# Patient Record
Sex: Male | Born: 1945 | Race: White | Hispanic: No | Marital: Married | Smoking: Former smoker
Health system: Southern US, Community
[De-identification: ages and names within clinical notes are randomized; demographics above are authoritative.]

## PROBLEM LIST (undated history)

## (undated) DIAGNOSIS — I429 Cardiomyopathy, unspecified: Secondary | ICD-10-CM

## (undated) DIAGNOSIS — I2510 Atherosclerotic heart disease of native coronary artery without angina pectoris: Secondary | ICD-10-CM

## (undated) DIAGNOSIS — K5792 Diverticulitis of intestine, part unspecified, without perforation or abscess without bleeding: Secondary | ICD-10-CM

## (undated) DIAGNOSIS — I509 Heart failure, unspecified: Secondary | ICD-10-CM

## (undated) DIAGNOSIS — N189 Chronic kidney disease, unspecified: Secondary | ICD-10-CM

## (undated) DIAGNOSIS — G629 Polyneuropathy, unspecified: Secondary | ICD-10-CM

## (undated) HISTORY — PX: ROTATOR CUFF REPAIR: SHX139

## (undated) HISTORY — PX: APPENDECTOMY: SHX54

## (undated) HISTORY — PX: CARDIAC CATHETERIZATION: SHX172

## (undated) HISTORY — PX: FINGER GANGLION CYST EXCISION: SHX1636

## (undated) HISTORY — PX: PARTIAL COLECTOMY: SHX5273

## (undated) HISTORY — PX: CORONARY ANGIOPLASTY: SHX604

## (undated) HISTORY — PX: OTHER SURGICAL HISTORY: SHX169

## (undated) HISTORY — PX: ELBOW SURGERY: SHX618

---

## 2010-03-29 DIAGNOSIS — I2510 Atherosclerotic heart disease of native coronary artery without angina pectoris: Secondary | ICD-10-CM

## 2010-03-29 HISTORY — DX: Atherosclerotic heart disease of native coronary artery without angina pectoris: I25.10

## 2012-03-29 HISTORY — PX: INSERT / REPLACE / REMOVE PACEMAKER: SUR710

## 2016-08-02 ENCOUNTER — Inpatient Hospital Stay (HOSPITAL_COMMUNITY)
Admission: AD | Admit: 2016-08-02 | Discharge: 2016-08-10 | DRG: 308 | Disposition: A | Discharge status: Home / Self Care | Payer: Non-veteran care | Source: Other Acute Inpatient Hospital | Attending: Family Medicine | Admitting: Family Medicine

## 2016-08-02 ENCOUNTER — Encounter (HOSPITAL_COMMUNITY): Payer: Self-pay | Admitting: Cardiology

## 2016-08-02 DIAGNOSIS — E876 Hypokalemia: Secondary | ICD-10-CM | POA: Diagnosis not present

## 2016-08-02 DIAGNOSIS — I480 Paroxysmal atrial fibrillation: Secondary | ICD-10-CM | POA: Diagnosis present

## 2016-08-02 DIAGNOSIS — R778 Other specified abnormalities of plasma proteins: Secondary | ICD-10-CM

## 2016-08-02 DIAGNOSIS — R7989 Other specified abnormal findings of blood chemistry: Secondary | ICD-10-CM | POA: Diagnosis not present

## 2016-08-02 DIAGNOSIS — J9600 Acute respiratory failure, unspecified whether with hypoxia or hypercapnia: Secondary | ICD-10-CM | POA: Diagnosis present

## 2016-08-02 DIAGNOSIS — Z7902 Long term (current) use of antithrombotics/antiplatelets: Secondary | ICD-10-CM

## 2016-08-02 DIAGNOSIS — I4729 Other ventricular tachycardia: Secondary | ICD-10-CM

## 2016-08-02 DIAGNOSIS — I5023 Acute on chronic systolic (congestive) heart failure: Secondary | ICD-10-CM | POA: Diagnosis present

## 2016-08-02 DIAGNOSIS — N170 Acute kidney failure with tubular necrosis: Secondary | ICD-10-CM | POA: Diagnosis present

## 2016-08-02 DIAGNOSIS — I472 Ventricular tachycardia: Secondary | ICD-10-CM

## 2016-08-02 DIAGNOSIS — I255 Ischemic cardiomyopathy: Secondary | ICD-10-CM | POA: Diagnosis present

## 2016-08-02 DIAGNOSIS — N179 Acute kidney failure, unspecified: Secondary | ICD-10-CM | POA: Diagnosis not present

## 2016-08-02 DIAGNOSIS — I252 Old myocardial infarction: Secondary | ICD-10-CM | POA: Diagnosis not present

## 2016-08-02 DIAGNOSIS — Z87891 Personal history of nicotine dependence: Secondary | ICD-10-CM | POA: Diagnosis not present

## 2016-08-02 DIAGNOSIS — K7589 Other specified inflammatory liver diseases: Secondary | ICD-10-CM | POA: Diagnosis present

## 2016-08-02 DIAGNOSIS — Z95810 Presence of automatic (implantable) cardiac defibrillator: Secondary | ICD-10-CM | POA: Diagnosis not present

## 2016-08-02 DIAGNOSIS — I248 Other forms of acute ischemic heart disease: Secondary | ICD-10-CM | POA: Diagnosis present

## 2016-08-02 DIAGNOSIS — I2510 Atherosclerotic heart disease of native coronary artery without angina pectoris: Secondary | ICD-10-CM | POA: Diagnosis present

## 2016-08-02 DIAGNOSIS — Z7901 Long term (current) use of anticoagulants: Secondary | ICD-10-CM | POA: Diagnosis not present

## 2016-08-02 DIAGNOSIS — K859 Acute pancreatitis without necrosis or infection, unspecified: Secondary | ICD-10-CM | POA: Diagnosis present

## 2016-08-02 DIAGNOSIS — R0989 Other specified symptoms and signs involving the circulatory and respiratory systems: Secondary | ICD-10-CM

## 2016-08-02 DIAGNOSIS — E875 Hyperkalemia: Secondary | ICD-10-CM | POA: Diagnosis present

## 2016-08-02 DIAGNOSIS — I4891 Unspecified atrial fibrillation: Secondary | ICD-10-CM

## 2016-08-02 DIAGNOSIS — R0602 Shortness of breath: Secondary | ICD-10-CM

## 2016-08-02 DIAGNOSIS — I420 Dilated cardiomyopathy: Secondary | ICD-10-CM

## 2016-08-02 DIAGNOSIS — I429 Cardiomyopathy, unspecified: Secondary | ICD-10-CM | POA: Diagnosis not present

## 2016-08-02 DIAGNOSIS — K7200 Acute and subacute hepatic failure without coma: Secondary | ICD-10-CM | POA: Diagnosis present

## 2016-08-02 DIAGNOSIS — Z7982 Long term (current) use of aspirin: Secondary | ICD-10-CM

## 2016-08-02 DIAGNOSIS — G629 Polyneuropathy, unspecified: Secondary | ICD-10-CM | POA: Diagnosis present

## 2016-08-02 DIAGNOSIS — Z79899 Other long term (current) drug therapy: Secondary | ICD-10-CM | POA: Diagnosis not present

## 2016-08-02 DIAGNOSIS — I509 Heart failure, unspecified: Secondary | ICD-10-CM | POA: Diagnosis not present

## 2016-08-02 DIAGNOSIS — F1020 Alcohol dependence, uncomplicated: Secondary | ICD-10-CM | POA: Diagnosis present

## 2016-08-02 DIAGNOSIS — R945 Abnormal results of liver function studies: Secondary | ICD-10-CM

## 2016-08-02 DIAGNOSIS — D649 Anemia, unspecified: Secondary | ICD-10-CM | POA: Diagnosis not present

## 2016-08-02 DIAGNOSIS — Z955 Presence of coronary angioplasty implant and graft: Secondary | ICD-10-CM

## 2016-08-02 DIAGNOSIS — D6959 Other secondary thrombocytopenia: Secondary | ICD-10-CM | POA: Diagnosis present

## 2016-08-02 DIAGNOSIS — D696 Thrombocytopenia, unspecified: Secondary | ICD-10-CM | POA: Diagnosis not present

## 2016-08-02 DIAGNOSIS — D638 Anemia in other chronic diseases classified elsewhere: Secondary | ICD-10-CM | POA: Diagnosis present

## 2016-08-02 DIAGNOSIS — N19 Unspecified kidney failure: Secondary | ICD-10-CM | POA: Diagnosis not present

## 2016-08-02 DIAGNOSIS — I342 Nonrheumatic mitral (valve) stenosis: Secondary | ICD-10-CM | POA: Diagnosis not present

## 2016-08-02 DIAGNOSIS — R748 Abnormal levels of other serum enzymes: Secondary | ICD-10-CM | POA: Diagnosis not present

## 2016-08-02 DIAGNOSIS — K5792 Diverticulitis of intestine, part unspecified, without perforation or abscess without bleeding: Secondary | ICD-10-CM | POA: Diagnosis present

## 2016-08-02 HISTORY — DX: Cardiomyopathy, unspecified: I42.9

## 2016-08-02 HISTORY — DX: Diverticulitis of intestine, part unspecified, without perforation or abscess without bleeding: K57.92

## 2016-08-02 HISTORY — DX: Polyneuropathy, unspecified: G62.9

## 2016-08-02 HISTORY — DX: Chronic kidney disease, unspecified: N18.9

## 2016-08-02 HISTORY — DX: Heart failure, unspecified: I50.9

## 2016-08-02 HISTORY — DX: Atherosclerotic heart disease of native coronary artery without angina pectoris: I25.10

## 2016-08-02 LAB — BASIC METABOLIC PANEL
Anion gap: 11 (ref 5–15)
BUN: 55 mg/dL — AB (ref 6–20)
CALCIUM: 9.2 mg/dL (ref 8.9–10.3)
CHLORIDE: 105 mmol/L (ref 101–111)
CO2: 21 mmol/L — AB (ref 22–32)
CREATININE: 2.79 mg/dL — AB (ref 0.61–1.24)
GFR calc Af Amer: 25 mL/min — ABNORMAL LOW (ref >60)
GFR calc non Af Amer: 21 mL/min — ABNORMAL LOW (ref >60)
Glucose, Bld: 106 mg/dL — ABNORMAL HIGH (ref 65–99)
Potassium: 5.6 mmol/L — ABNORMAL HIGH (ref 3.5–5.1)
SODIUM: 137 mmol/L (ref 135–145)

## 2016-08-02 LAB — TROPONIN I: TROPONIN I: 0.23 ng/mL — AB (ref <0.03)

## 2016-08-02 LAB — HEPATIC FUNCTION PANEL
ALT: 88 U/L — ABNORMAL HIGH (ref 17–63)
AST: 59 U/L — ABNORMAL HIGH (ref 15–41)
Albumin: 4 g/dL (ref 3.5–5.0)
Alkaline Phosphatase: 79 U/L (ref 38–126)
BILIRUBIN INDIRECT: 1.5 mg/dL — AB (ref 0.3–0.9)
Bilirubin, Direct: 0.3 mg/dL (ref 0.1–0.5)
TOTAL PROTEIN: 6.8 g/dL (ref 6.5–8.1)
Total Bilirubin: 1.8 mg/dL — ABNORMAL HIGH (ref 0.3–1.2)

## 2016-08-02 LAB — LIPID PANEL
CHOLESTEROL: 145 mg/dL (ref 0–200)
HDL: 47 mg/dL (ref >40)
LDL Cholesterol: 86 mg/dL (ref 0–99)
TRIGLYCERIDES: 59 mg/dL (ref <150)
Total CHOL/HDL Ratio: 3.1 RATIO
VLDL: 12 mg/dL (ref 0–40)

## 2016-08-02 LAB — CBC WITH DIFFERENTIAL/PLATELET
BASOS PCT: 0 %
Basophils Absolute: 0 10*3/uL (ref 0.0–0.1)
EOS PCT: 0 %
Eosinophils Absolute: 0 10*3/uL (ref 0.0–0.7)
HCT: 38.8 % — ABNORMAL LOW (ref 39.0–52.0)
Hemoglobin: 12.9 g/dL — ABNORMAL LOW (ref 13.0–17.0)
LYMPHS ABS: 2.2 10*3/uL (ref 0.7–4.0)
Lymphocytes Relative: 21 %
MCH: 31.9 pg (ref 26.0–34.0)
MCHC: 33.2 g/dL (ref 30.0–36.0)
MCV: 95.8 fL (ref 78.0–100.0)
MONOS PCT: 7 %
Monocytes Absolute: 0.7 10*3/uL (ref 0.1–1.0)
Neutro Abs: 7.4 10*3/uL (ref 1.7–7.7)
Neutrophils Relative %: 71 %
PLATELETS: 108 10*3/uL — AB (ref 150–400)
RBC: 4.05 MIL/uL — ABNORMAL LOW (ref 4.22–5.81)
RDW: 13.4 % (ref 11.5–15.5)
WBC: 10.4 10*3/uL (ref 4.0–10.5)

## 2016-08-02 LAB — TSH: TSH: 6.757 u[IU]/mL — ABNORMAL HIGH (ref 0.350–4.500)

## 2016-08-02 MED ORDER — DULOXETINE HCL 60 MG PO CPEP
90.0000 mg | ORAL_CAPSULE | Freq: Every day | ORAL | Status: DC
  Administered 2016-08-03 – 2016-08-10 (×8): 90 mg via ORAL
  Filled 2016-08-02 (×8): qty 1

## 2016-08-02 MED ORDER — CLOPIDOGREL BISULFATE 75 MG PO TABS
75.0000 mg | ORAL_TABLET | Freq: Every day | ORAL | Status: DC
  Administered 2016-08-03 – 2016-08-10 (×8): 75 mg via ORAL
  Filled 2016-08-02 (×8): qty 1

## 2016-08-02 MED ORDER — DILTIAZEM HCL-DEXTROSE 100-5 MG/100ML-% IV SOLN (PREMIX)
5.0000 mg/h | INTRAVENOUS | Status: DC
  Administered 2016-08-02: 10 mg/h via INTRAVENOUS
  Administered 2016-08-03: 5 mg/h via INTRAVENOUS
  Filled 2016-08-02 (×2): qty 100

## 2016-08-02 MED ORDER — METOPROLOL TARTRATE 12.5 MG HALF TABLET
12.5000 mg | ORAL_TABLET | Freq: Two times a day (BID) | ORAL | Status: DC
  Administered 2016-08-02 – 2016-08-03 (×2): 12.5 mg via ORAL
  Filled 2016-08-02 (×2): qty 1

## 2016-08-02 MED ORDER — DILTIAZEM LOAD VIA INFUSION
10.0000 mg | Freq: Once | INTRAVENOUS | Status: AC
  Administered 2016-08-02: 10 mg via INTRAVENOUS
  Filled 2016-08-02: qty 10

## 2016-08-02 MED ORDER — ATORVASTATIN CALCIUM 80 MG PO TABS
80.0000 mg | ORAL_TABLET | Freq: Every day | ORAL | Status: DC
  Administered 2016-08-03: 80 mg via ORAL
  Filled 2016-08-02: qty 1

## 2016-08-02 MED ORDER — ASPIRIN EC 81 MG PO TBEC
81.0000 mg | DELAYED_RELEASE_TABLET | Freq: Every day | ORAL | Status: DC
  Administered 2016-08-03: 81 mg via ORAL
  Filled 2016-08-02: qty 1

## 2016-08-02 MED ORDER — HEPARIN (PORCINE) IN NACL 100-0.45 UNIT/ML-% IJ SOLN
1350.0000 [IU]/h | INTRAMUSCULAR | Status: DC
  Administered 2016-08-02 – 2016-08-06 (×6): 1200 [IU]/h via INTRAVENOUS
  Administered 2016-08-08: 1350 [IU]/h via INTRAVENOUS
  Filled 2016-08-02 (×9): qty 250

## 2016-08-02 NOTE — Consult Note (Addendum)
Admit date: 08/02/2016 Referring Physician: Dr. Bard Herbert Primary Cardiologist: Dr. Eden Emms Chief complaint/reason for admission:afib with RVR  HPI: Jerry Baker is a 71 y.o. male who is being seen today for the evaluation of afib with RVr at the request of Dr. Jeri Lager at [facility].    This is 71yo male with a history ASCAD with a history of stent placement x 2 in the past, mild LV dysfunction with EF 40% and history of AICD and PPM reportedly by the patient but no chart in Care Everywhere.  He presented to [facility] with complaints of SOB and palpitations that started last Wednesday.  He says this apparently started after he had a CT with contrast of his right shoulder and the next day started to feel funny last week.  He then started to notice that his BP and HR on his BP cuff were fluctuating.  He says that he would break out in a sweat and then it would subside. He waited 2-3 days as he thought it was the contrast.  Over the weekend he started getting SOB and felt bad but not bad enough to play golf today. When he got home from golf he started feeling nauseated and vomited.  He presented to the ER and was found to be in afib with RVR with HR 140-160bpm.  He denied any chest pain or pressure.   He is on Plavix and ASA for his CAD but has not had any prior history of afib. He was loaded with IV heparin gtt and started on IV Cardizem.  Creatinine was noted to be elevated at 2.6 c/w acute renal failure ? Secondary to contrast mediated nephropathy from CT last week.  He was noted to have an elevated troponin at 0.15.    PMH:    Past Medical History:  Diagnosis Date  . Cardiomyopathy (HCC)    EF 40% s/p dual chamber AICD  . CHF (congestive heart failure) (HCC)   . CKD (chronic kidney disease)    told by his PCP about 8 months ago and was seen by a nephrologist and his ACE I was stopped.    . Coronary artery disease 2012   s/p MI 2012 with PCI and 2 stents  .  Diverticulitis   . Peripheral neuropathy     PSH:    Past Surgical History:  Procedure Laterality Date  . APPENDECTOMY    . CARDIAC CATHETERIZATION    . CORONARY ANGIOPLASTY    . ELBOW SURGERY Left   . FINGER GANGLION CYST EXCISION    . INSERT / REPLACE / REMOVE PACEMAKER  2014   dual chamber AICD  . ROTATOR CUFF REPAIR Left     ALLERGIES:   Penicillins  Prior to Admit Meds:   Prescriptions Prior to Admission  Medication Sig Dispense Refill Last Dose  . aspirin EC 81 MG tablet Take 81 mg by mouth daily.     . clopidogrel (PLAVIX) 75 MG tablet Take 75 mg by mouth daily.     . Cyanocobalamin (VITAMIN B 12 PO) Take 100 mcg by mouth daily.     . DULoxetine (CYMBALTA) 20 MG capsule Take 30 mg by mouth daily.       Family HX:    Family History  Problem Relation Age of Onset  . Dementia Mother   . Breast cancer Mother   . Cancer Father   . Parkinson's disease Sister    Social HX:    Social History  Social History  . Marital status: N/A    Spouse name: N/A  . Number of children: N/A  . Years of education: N/A   Occupational History  . Not on file.   Social History Main Topics  . Smoking status: Former Smoker    Quit date: 08/03/2010  . Smokeless tobacco: Never Used  . Alcohol use 4.2 oz/week    7 Cans of beer per week  . Drug use: No  . Sexual activity: Not on file   Other Topics Concern  . Not on file   Social History Narrative  . No narrative on file     ROS:  All ROS were addressed and are negative except what is stated in the HPI  PHYSICAL EXAM Vitals:   08/02/16 2000  Resp: (!) 25   General: Well developed, well nourished, in no acute distress Head: Eyes PERRLA, No xanthomas.   Normal cephalic and atramatic  Lungs:   Clear bilaterally to auscultation and percussion. Heart:   Irregularly irregular S1 S2 Pulses are 2+ & equal.            No carotid bruit. No JVD.  No abdominal bruits. No femoral bruits. Abdomen: Bowel sounds are positive, abdomen soft  and non-tender without masses  Msk:  Back normal, normal gait. Normal strength and tone for age. Extremities:   No clubbing, cyanosis or edema.  DP +1 Neuro: Alert and oriented X 3. Psych:  Good affect, responds appropriately   Labs:  No results found for: WBC, HGB, HCT, MCV, PLT No results for input(s): NA, K, CL, CO2, BUN, CREATININE, CALCIUM, PROT, BILITOT, ALKPHOS, ALT, AST, GLUCOSE in the last 168 hours.  Invalid input(s): LABALBU No results found for: CKTOTAL, CKMB, CKMBINDEX, TROPONINI No results found for: PTT No results found for: INR, PROTIME  No results found for: CHOL No results found for: HDL No results found for: LDLCALC No results found for: TRIG No results found for: CHOLHDL No results found for: LDLDIRECT    Radiology:  No results found.   Telemetry    Atrial fibrillation with RVR - Personally Reviewed  ECG    Atrial fibrillation with RVR at 103bpm with inferior infarct, anteroseptal infarct and ST/T wave abnormality in the anterolateral leads - Personally Reviewed   ASSESSMENT/PLAN:    1.  New onset atrial fibrillation of unknown duration - most likely a week ago.  His HR is still high in the 130's.  He currently on IV Heparin gtt and Cardizem gtt.   - Continue IV Heparin per pharmacy - Continue IV Cardizem gtt and titrate for HR as BP allows - CHADS2VASC score is 3 (CAD, age > 30 and CHF) so will need long term anticoagulation.  Given underlying acute renal failure he is not a candidate for NOAC and warfarin should be used. Will start once assured that no procedures will need to be done.  - Check TSH - check 2D echo in am for LA size and LVF. - continue Coreg - he was on this at home but does not know the dose.    2.  ACSAD with remote MI x 2 and s/p remote PCI x 2.  No old records available. - continue ASA/Plavix for now - Will likely stop ASA and continue on Plavix once warfarin is started - he is currently not on statin therapy.   - Will add  Lipitor 80mg  daily - check FLP and ALT in am - Will try to get old records. -  check 2D echo in am  3.  DCM - reportedly with EF 40%.  He is s/p AICD - await results of echo - had been on Coreg but does not know the dose.  Will add Lopressor 12.5mg  BID for better rate control - no ACE or ARB due to AKI  4.  Acute renal failure with N/V ? Etiology.  Symptoms started after IV contrast last week for his shoulder.  ? Contrast induced nephropathy. - workup per IM  5.  Mildly elevated troponin - likely secondary to demand ischemia in the setting of afib with RVR and acute kidney injury.  He has not had any CP. - will continue to cycle - 2D echo pending  Armanda Magicraci Dynastie Knoop, MD  08/02/2016  9:37 PM

## 2016-08-02 NOTE — Progress Notes (Signed)
ANTICOAGULATION CONSULT NOTE - Initial Consult  Pharmacy Consult for Heparin Indication: atrial fibrillation  Allergies  Allergen Reactions  . Penicillins     Patient Measurements:  Weight = 91 kg   Vital Signs:    Labs: No results for input(s): HGB, HCT, PLT, APTT, LABPROT, INR, HEPARINUNFRC, HEPRLOWMOCWT, CREATININE, CKTOTAL, CKMB, TROPONINI in the last 72 hours.  CrCl cannot be calculated (No order found.).   Medical History: Past Medical History:  Diagnosis Date  . Cardiomyopathy (HCC)    EF 40% s/p dual chamber AICD  . CHF (congestive heart failure) (HCC)   . CKD (chronic kidney disease)    told by his PCP about 8 months ago and was seen by a nephrologist and his ACE I was stopped.    . Coronary artery disease 2012   s/p MI 2012 with PCI and 2 stents  . Diverticulitis   . Peripheral neuropathy      Assessment: [age] YOM [facility] with new onset Afib RVR and AKI. Pharmacy is consulted to manage heparin dosing. Heparin was started prior to transfer at 1730. Pt received 5000 unit bolus, current infusion rate is only 400 units/hr. Baseline hgb 13.1, pltc 106K, INR 1.2   Goal of Therapy:  Heparin level 0.3-0.7 units/ml Monitor platelets by anticoagulation protocol: Yes   Plan:  Heparin infusion 1200 units/hr (RN notified to switch to a new bag with our supply) F/u 8 hr heparin level at 0700 Daily heparin level and CBC f/u plans for oral anticoagulation  Bayard HuggerMei Kelli Robeck, PharmD, BCPS  Clinical Pharmacist  Pager: [phone]   08/02/2016,10:03 PM

## 2016-08-03 ENCOUNTER — Inpatient Hospital Stay (HOSPITAL_COMMUNITY): Payer: Non-veteran care

## 2016-08-03 ENCOUNTER — Encounter (HOSPITAL_COMMUNITY): Payer: Self-pay | Admitting: Internal Medicine

## 2016-08-03 DIAGNOSIS — N19 Unspecified kidney failure: Secondary | ICD-10-CM

## 2016-08-03 DIAGNOSIS — I429 Cardiomyopathy, unspecified: Secondary | ICD-10-CM

## 2016-08-03 DIAGNOSIS — I4891 Unspecified atrial fibrillation: Secondary | ICD-10-CM

## 2016-08-03 DIAGNOSIS — D696 Thrombocytopenia, unspecified: Secondary | ICD-10-CM

## 2016-08-03 DIAGNOSIS — I2510 Atherosclerotic heart disease of native coronary artery without angina pectoris: Secondary | ICD-10-CM

## 2016-08-03 DIAGNOSIS — I255 Ischemic cardiomyopathy: Secondary | ICD-10-CM

## 2016-08-03 DIAGNOSIS — F1020 Alcohol dependence, uncomplicated: Secondary | ICD-10-CM

## 2016-08-03 DIAGNOSIS — I509 Heart failure, unspecified: Secondary | ICD-10-CM

## 2016-08-03 DIAGNOSIS — R0602 Shortness of breath: Secondary | ICD-10-CM

## 2016-08-03 DIAGNOSIS — N179 Acute kidney failure, unspecified: Secondary | ICD-10-CM

## 2016-08-03 DIAGNOSIS — D649 Anemia, unspecified: Secondary | ICD-10-CM

## 2016-08-03 LAB — FOLATE: Folate: 59.7 ng/mL (ref >5.9)

## 2016-08-03 LAB — CBC
HEMATOCRIT: 37.8 % — AB (ref 39.0–52.0)
HEMOGLOBIN: 12.4 g/dL — AB (ref 13.0–17.0)
MCH: 31.2 pg (ref 26.0–34.0)
MCHC: 32.8 g/dL (ref 30.0–36.0)
MCV: 95.2 fL (ref 78.0–100.0)
PLATELETS: 101 10*3/uL — AB (ref 150–400)
RBC: 3.97 MIL/uL — AB (ref 4.22–5.81)
RDW: 13.5 % (ref 11.5–15.5)
WBC: 8.4 10*3/uL (ref 4.0–10.5)

## 2016-08-03 LAB — URINALYSIS, ROUTINE W REFLEX MICROSCOPIC
Bilirubin Urine: NEGATIVE
Glucose, UA: NEGATIVE mg/dL
Hgb urine dipstick: NEGATIVE
Ketones, ur: 5 mg/dL — AB
Leukocytes, UA: NEGATIVE
Nitrite: NEGATIVE
Protein, ur: 100 mg/dL — AB
RBC / HPF: NONE SEEN RBC/hpf (ref 0–5)
Specific Gravity, Urine: 1.025 (ref 1.005–1.030)
Squamous Epithelial / HPF: NONE SEEN
pH: 5 (ref 5.0–8.0)

## 2016-08-03 LAB — BASIC METABOLIC PANEL
ANION GAP: 11 (ref 5–15)
BUN: 61 mg/dL — ABNORMAL HIGH (ref 6–20)
CHLORIDE: 106 mmol/L (ref 101–111)
CO2: 20 mmol/L — ABNORMAL LOW (ref 22–32)
Calcium: 8.7 mg/dL — ABNORMAL LOW (ref 8.9–10.3)
Creatinine, Ser: 3.35 mg/dL — ABNORMAL HIGH (ref 0.61–1.24)
GFR calc Af Amer: 20 mL/min — ABNORMAL LOW (ref >60)
GFR, EST NON AFRICAN AMERICAN: 17 mL/min — AB (ref >60)
GLUCOSE: 153 mg/dL — AB (ref 65–99)
POTASSIUM: 5.5 mmol/L — AB (ref 3.5–5.1)
Sodium: 137 mmol/L (ref 135–145)

## 2016-08-03 LAB — LACTATE DEHYDROGENASE: LDH: 1069 U/L — ABNORMAL HIGH (ref 98–192)

## 2016-08-03 LAB — DIC (DISSEMINATED INTRAVASCULAR COAGULATION)PANEL
D-Dimer, Quant: 3.62 ug/mL-FEU — ABNORMAL HIGH (ref 0.00–0.50)
INR: 1.59
Platelets: 95 10*3/uL — ABNORMAL LOW (ref 150–400)
Prothrombin Time: 19.1 seconds — ABNORMAL HIGH (ref 11.4–15.2)
aPTT: 113 seconds — ABNORMAL HIGH (ref 24–36)

## 2016-08-03 LAB — RETICULOCYTES
RBC.: 4.07 MIL/uL — ABNORMAL LOW (ref 4.22–5.81)
RETIC CT PCT: 1.6 % (ref 0.4–3.1)
Retic Count, Absolute: 65.1 10*3/uL (ref 19.0–186.0)

## 2016-08-03 LAB — TROPONIN I
Troponin I: 0.23 ng/mL
Troponin I: 0.17 ng/mL (ref <0.03)

## 2016-08-03 LAB — SODIUM, URINE, RANDOM: Sodium, Ur: <10 mmol/L

## 2016-08-03 LAB — RAPID URINE DRUG SCREEN, HOSP PERFORMED
Amphetamines: NOT DETECTED
BARBITURATES: NOT DETECTED
Benzodiazepines: NOT DETECTED
COCAINE: NOT DETECTED
Opiates: NOT DETECTED
TETRAHYDROCANNABINOL: NOT DETECTED

## 2016-08-03 LAB — DIC (DISSEMINATED INTRAVASCULAR COAGULATION) PANEL
FIBRINOGEN: 364 mg/dL (ref 210–475)
SMEAR REVIEW: NONE SEEN

## 2016-08-03 LAB — IRON AND TIBC
Iron: 129 ug/dL (ref 45–182)
Saturation Ratios: 41 % — ABNORMAL HIGH (ref 17.9–39.5)
TIBC: 318 ug/dL (ref 250–450)
UIBC: 189 ug/dL

## 2016-08-03 LAB — MAGNESIUM: Magnesium: 2.4 mg/dL (ref 1.7–2.4)

## 2016-08-03 LAB — HEPARIN LEVEL (UNFRACTIONATED)
HEPARIN UNFRACTIONATED: 0.51 [IU]/mL (ref 0.30–0.70)
Heparin Unfractionated: 0.33 IU/mL (ref 0.30–0.70)

## 2016-08-03 LAB — PATHOLOGIST SMEAR REVIEW

## 2016-08-03 LAB — LIPASE, BLOOD: Lipase: 21 U/L (ref 11–51)

## 2016-08-03 LAB — FERRITIN: FERRITIN: 1209 ng/mL — AB (ref 24–336)

## 2016-08-03 LAB — BRAIN NATRIURETIC PEPTIDE: B NATRIURETIC PEPTIDE 5: 763.3 pg/mL — AB (ref 0.0–100.0)

## 2016-08-03 LAB — VITAMIN B12: Vitamin B-12: 5493 pg/mL — ABNORMAL HIGH (ref 180–914)

## 2016-08-03 LAB — LACTIC ACID, PLASMA: Lactic Acid, Venous: 1.9 mmol/L (ref 0.5–1.9)

## 2016-08-03 LAB — CREATININE, URINE, RANDOM: Creatinine, Urine: 322.5 mg/dL

## 2016-08-03 MED ORDER — DILTIAZEM HCL 30 MG PO TABS
30.0000 mg | ORAL_TABLET | Freq: Four times a day (QID) | ORAL | Status: DC
  Administered 2016-08-03 – 2016-08-04 (×3): 30 mg via ORAL
  Filled 2016-08-03 (×3): qty 1

## 2016-08-03 MED ORDER — PROMETHAZINE HCL 25 MG/ML IJ SOLN
12.5000 mg | Freq: Once | INTRAMUSCULAR | Status: AC
  Administered 2016-08-03: 12.5 mg via INTRAVENOUS
  Filled 2016-08-03: qty 1

## 2016-08-03 MED ORDER — SODIUM CHLORIDE 0.9 % IV BOLUS (SEPSIS): 500.0000 mL | Freq: Once | INTRAVENOUS | Status: DC

## 2016-08-03 MED ORDER — SODIUM POLYSTYRENE SULFONATE 15 GM/60ML PO SUSP
30.0000 g | Freq: Once | ORAL | Status: AC
  Administered 2016-08-03: 30 g via ORAL
  Filled 2016-08-03: qty 120

## 2016-08-03 MED ORDER — SODIUM CHLORIDE 0.9 % IV SOLN
INTRAVENOUS | Status: DC
  Administered 2016-08-03: 10:00:00 via INTRAVENOUS

## 2016-08-03 MED ORDER — THIAMINE HCL 100 MG/ML IJ SOLN: 100.0000 mg | Freq: Every day | INTRAMUSCULAR | Status: DC

## 2016-08-03 MED ORDER — WARFARIN SODIUM 5 MG PO TABS
5.0000 mg | ORAL_TABLET | Freq: Once | ORAL | Status: AC
  Administered 2016-08-03: 5 mg via ORAL
  Filled 2016-08-03: qty 1

## 2016-08-03 MED ORDER — ADULT MULTIVITAMIN W/MINERALS CH
1.0000 | ORAL_TABLET | Freq: Every day | ORAL | Status: DC
  Administered 2016-08-03 – 2016-08-10 (×8): 1 via ORAL
  Filled 2016-08-03 (×8): qty 1

## 2016-08-03 MED ORDER — PROMETHAZINE HCL 25 MG/ML IJ SOLN
12.5000 mg | Freq: Four times a day (QID) | INTRAMUSCULAR | Status: DC | PRN
  Administered 2016-08-03: 12.5 mg via INTRAVENOUS
  Filled 2016-08-03: qty 1

## 2016-08-03 MED ORDER — ACETAMINOPHEN 325 MG PO TABS: 650.0000 mg | ORAL_TABLET | Freq: Four times a day (QID) | ORAL | Status: DC | PRN

## 2016-08-03 MED ORDER — SODIUM CHLORIDE 0.9 % IV BOLUS (SEPSIS)
500.0000 mL | Freq: Once | INTRAVENOUS | Status: AC
  Administered 2016-08-03: 500 mL via INTRAVENOUS

## 2016-08-03 MED ORDER — FOLIC ACID 1 MG PO TABS
1.0000 mg | ORAL_TABLET | Freq: Every day | ORAL | Status: DC
  Administered 2016-08-03 – 2016-08-10 (×8): 1 mg via ORAL
  Filled 2016-08-03 (×8): qty 1

## 2016-08-03 MED ORDER — ACETAMINOPHEN 650 MG RE SUPP: 650.0000 mg | Freq: Four times a day (QID) | RECTAL | Status: DC | PRN

## 2016-08-03 MED ORDER — LORAZEPAM 1 MG PO TABS
1.0000 mg | ORAL_TABLET | Freq: Four times a day (QID) | ORAL | Status: DC | PRN
  Administered 2016-08-04: 1 mg via ORAL
  Filled 2016-08-03: qty 1

## 2016-08-03 MED ORDER — VITAMIN B-1 100 MG PO TABS
100.0000 mg | ORAL_TABLET | Freq: Every day | ORAL | Status: DC
  Administered 2016-08-03 – 2016-08-10 (×8): 100 mg via ORAL
  Filled 2016-08-03 (×8): qty 1

## 2016-08-03 MED ORDER — SODIUM CHLORIDE 0.9 % IV SOLN
INTRAVENOUS | Status: DC
  Administered 2016-08-03 (×2): via INTRAVENOUS

## 2016-08-03 MED ORDER — LORAZEPAM 2 MG/ML IJ SOLN: 1.0000 mg | Freq: Four times a day (QID) | INTRAMUSCULAR | Status: DC | PRN

## 2016-08-03 MED ORDER — METOPROLOL TARTRATE 50 MG PO TABS
50.0000 mg | ORAL_TABLET | Freq: Two times a day (BID) | ORAL | Status: DC
  Administered 2016-08-03 – 2016-08-08 (×10): 50 mg via ORAL
  Filled 2016-08-03 (×10): qty 1

## 2016-08-03 MED ORDER — WARFARIN - PHARMACIST DOSING INPATIENT: Freq: Every day | Status: DC

## 2016-08-03 NOTE — Final Progress Note (Signed)
Attempted echocardiogram but HR in the high 120's. We try again later. B.Charlee Squibb

## 2016-08-03 NOTE — Progress Notes (Signed)
Progress Note  Patient Name: Jerry Baker Date of Encounter: 08/03/2016  Primary Cardiologist: Armanda Magic Wants to continue to f/u with [facility] on d/c  Subjective   Less dyspnea no chest pain   Inpatient Medications    Scheduled Meds: . aspirin EC  81 mg Oral Daily  . atorvastatin  80 mg Oral q1800  . clopidogrel  75 mg Oral Daily  . DULoxetine  90 mg Oral Daily  . folic acid  1 mg Oral Daily  . metoprolol tartrate  12.5 mg Oral BID  . multivitamin with minerals  1 tablet Oral Daily  . thiamine  100 mg Oral Daily   Or  . thiamine  100 mg Intravenous Daily   Continuous Infusions: . sodium chloride 125 mL/hr at 08/03/16 0950  . diltiazem (CARDIZEM) infusion Stopped (08/03/16 0303)  . heparin 1,200 Units/hr (08/02/16 2248)   PRN Meds: acetaminophen **OR** acetaminophen, LORazepam **OR** LORazepam, promethazine   Vital Signs    Vitals:   08/03/16 0613 08/03/16 0645 08/03/16 0655 08/03/16 0827  BP:   (!) 108/91 (!) 108/91  Pulse: 98 (!) 54 (!) 114 (!) 108  Resp: (!) 23 20 (!) 23   Temp:    98.6 F (37 C)  TempSrc:    Oral  SpO2: 95% 94% 91% 90%  Weight: 200 lb 12.8 oz (91.1 kg)       Intake/Output Summary (Last 24 hours) at 08/03/16 0956 Last data filed at 08/03/16 0013  Gross per 24 hour  Intake                0 ml  Output              175 ml  Net             -175 ml   Filed Weights   08/03/16 1610  Weight: 200 lb 12.8 oz (91.1 kg)    Telemetry    Afib rates 12  - Personally Reviewed  ECG    Afib IVCD LBBB type  - Personally Reviewed  Physical Exam  White male no distress GEN: No acute distress.   Neck: No JVD Cardiac: RRR, MR  murmurs, rubs, or gallops. AICD under left clavicle   Respiratory: Clear to auscultation bilaterally. GI: Soft, nontender, non-distended  MS: No edema; No deformity. Neuro:  Nonfocal  Psych: Normal affect   Labs    Chemistry  Recent Labs Lab 08/02/16 2157 08/03/16 0422  NA 137 137  K 5.6* 5.5*  CL 105  106  CO2 21* 20*  GLUCOSE 106* 153*  BUN 55* 61*  CREATININE 2.79* 3.35*  CALCIUM 9.2 8.7*  PROT 6.8  --   ALBUMIN 4.0  --   AST 59*  --   ALT 88*  --   ALKPHOS 79  --   BILITOT 1.8*  --   GFRNONAA 21* 17*  GFRAA 25* 20*  ANIONGAP 11 11     Hematology  Recent Labs Lab 08/02/16 2157 08/03/16 0422 08/03/16 0539 08/03/16 0727  WBC 10.4 8.4  --   --   RBC 4.05* 3.97* 4.07*  --   HGB 12.9* 12.4*  --   --   HCT 38.8* 37.8*  --   --   MCV 95.8 95.2  --   --   MCH 31.9 31.2  --   --   MCHC 33.2 32.8  --   --   RDW 13.4 13.5  --   --   PLT  108* 101*  --  95*    Cardiac Enzymes  Recent Labs Lab 08/02/16 2157 08/03/16 0422  TROPONINI 0.23* 0.23*   No results for input(s): TROPIPOC in the last 168 hours.   BNP  Recent Labs Lab 08/03/16 0422  BNP 763.3*     DDimer   Recent Labs Lab 08/03/16 0727  DDIMER 3.62*     Radiology    Dg Chest Port 1 View  Result Date: 08/03/2016 CLINICAL DATA:  Shortness of breath, history of CHF, cardiomyopathy, coronary artery disease, chronic renal insufficiency. EXAM: PORTABLE CHEST 1 VIEW COMPARISON:  None in PACs FINDINGS: The lungs are well-expanded. The interstitial markings are mildly prominent especially at the right lung base. The cardiac silhouette is enlarged. The pulmonary vascularity mildly engorged. The ICD is in stable position. There is calcification in the wall of the aortic arch. The bony thorax exhibits no acute abnormality. IMPRESSION: CHF with mild pulmonary interstitial edema. Minimal right infrahilar subsegmental atelectasis is suspected. Electronically Signed   By: David  SwazilandJordan M.D.   On: 08/03/2016 07:42   Ct Renal Stone Study  Result Date: 08/03/2016 CLINICAL DATA:  71 y/o M; right flank pain. History of chronic kidney disease, appendectomy, and partial colectomy. EXAM: CT ABDOMEN AND PELVIS WITHOUT CONTRAST TECHNIQUE: Multidetector CT imaging of the abdomen and pelvis was performed following the standard  protocol without IV contrast. COMPARISON:  None. FINDINGS: Lower chest: AICD lead in the right ventricle. Severe coronary artery calcification. Normal heart size. No pericardial effusion. Small right greater than left pleural effusions. Hepatobiliary: No focal liver abnormality is seen. No gallstones, gallbladder wall thickening, or biliary dilatation. Pancreas: There is indistinctness of fat surrounding the pancreatic and retroperitoneal edema greater on the right. Spleen: Normal in size without focal abnormality. Adrenals/Urinary Tract: Adrenal glands are unremarkable. Kidneys are normal, without renal calculi, focal lesion, or hydronephrosis. Bladder is unremarkable. Stomach/Bowel: Stomach is within normal limits. Appendix appears normal. No evidence of bowel wall thickening, distention, or inflammatory changes. Few sigmoid diverticular present. Colorectal anastomosis is patent without obstruction. Vascular/Lymphatic: Aortic atherosclerosis. No enlarged abdominal or pelvic lymph nodes. Reproductive: Prostate is unremarkable. Other: No abdominal wall hernia or abnormality. No abdominopelvic ascites. Musculoskeletal: Moderate spondylosis of the visible thoracolumbar spine. No acute osseous abnormality is evident. IMPRESSION: 1. Indistinctness of pancreatic head and mild nonspecific retroperitoneal edema may represent acute pancreatitis. Correlation with lipase recommended. 2. No hydronephrosis or urinary stone disease. 3. Mild sigmoid diverticulosis. No obstructive or inflammatory changes of bowel identified. 4. Small bilateral pleural effusions. Electronically Signed   By: Mitzi HansenLance  Furusawa-Stratton M.D.   On: 08/03/2016 03:02    Cardiac Studies   Echo pending   Patient Profile     71 y.o. 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 Previous stent placement Problems started with contrast for right shoulder. Transferred with dyspnea acute renal Failure troponin mildly elevated history of ETOH abuse  with DT risk  Assessment & Plan    Afib:  With elevated CR cannot have NOAC. Start coumadin continue heparin Discussed options And with DCM favor TEE/DCC will arrange for Thursday. Increase beta blocker d/c ASA  CAD: no chest pain no need for cath troponin elevated in setting CHF and elevated CR continue plavix  A/CRF:  Per primary service may be related to contrast and cardiorenal echo pending to see how bad EF is .   ETOH:  DT prophylaxis   Signed, Charlton HawsPeter Shikha Bibb, MD  08/03/2016, 9:56 AM

## 2016-08-03 NOTE — Progress Notes (Signed)
Patient admitted earlier by Dr Toniann FailKakrakandy , please see his note for detailed H&P.   Jerry Baker is a 71 y.o. male with history of CAD status post stenting, cardiomyopathy last EF measured was 40% with AICD and pacemaker placement, peripheral neuropathy [facility] with complaints of shortness of breath. He was found to be in afib with RVR, acute renal failure and in acute pancreatitis.   Plan: 1. Continue with diltiazem gtt,  2. Cardiology consultation  3. 2 d echocardiogram.  4. Resume fluid and get strict intake and output.  5. Bowel rest for pancreatitis.  6. CIWA - watch for alcohol withdrawal.     Jerry ModyVijaya Ikhlas Albo, MD [phone]

## 2016-08-03 NOTE — Progress Notes (Signed)
ANTICOAGULATION CONSULT NOTE - Pharmacy Consult for Heparin and start Coumadn Indication: atrial fibrillation  Allergies  Allergen Reactions  . Penicillins Other (See Comments)    Unknown    Patient Measurements: Weight: 200 lb 12.8 oz (91.1 kg)Weight = 91 kg   Vital Signs: Temp: 98.2 F (36.8 C) (05/08 1518) Temp Source: Oral (05/08 1518) BP: 114/73 (05/08 1530) Pulse Rate: 87 (05/08 1530)  Labs:  Recent Labs  08/02/16 2157 08/03/16 0422 08/03/16 [id number] 08/03/16 0727 08/03/16 0932 08/03/16 1352  HGB 12.9* 12.4*  --   --   --   --   HCT 38.8* 37.8*  --   --   --   --   PLT 108* 101*  --  95*  --   --   APTT  --   --   --  113*  --   --   LABPROT  --   --   --  19.1*  --   --   INR  --   --   --  1.59  --   --   HEPARINUNFRC  --   --  0.33  --   --  0.51  CREATININE 2.79* 3.35*  --   --   --   --   TROPONINI 0.23* 0.23*  --   --  0.17*  --     CrCl cannot be calculated (Unknown ideal weight.).   Medical History: Past Medical History:  Diagnosis Date  . Cardiomyopathy (HCC)    EF 40% s/p dual chamber AICD  . CHF (congestive heart failure) (HCC)   . CKD (chronic kidney disease)    told by his PCP about 8 months ago and was seen by a nephrologist and his ACE I was stopped.    . Coronary artery disease 2012   s/p MI 2012 with PCI and 2 stents  . Diverticulitis   . Peripheral neuropathy      Assessment: [age] Y[facility] with new onset Afib RVR and AKI. Pharmacy was consulted on 08/02/16 to manage heparin dosing.\ Baseline hgb 13.1, pltc 106K, INR 1.2 No oral anticoagulation PTA  2nd 6h heparin level is 0.51,remains therapeutic x2 on same rate of 1200 units/hr. No bleeding reported.   Goal of Therapy:  INR 2-3 Heparin level 0.3-0.7 units/ml Monitor platelets by anticoagulation protocol: Yes   Plan:  Continue Heparin infusion 1200 units/hr  Coumadin as previously ordered, starting today INR ,HL , cbc daily  Thank you for allowing  pharmacy to be part of this patients care team. Noah Delaineuth Symphany Fleissner, RPh Clinical Pharmacist Pager: [phone] Main Pharmacy [phone] 08/03/2016,4:33 PM

## 2016-08-03 NOTE — Consult Note (Signed)
Anees Vanecek is an 71 y.o. male referred by Dr Toniann Fail   Chief Complaint: Acute vs acute on CKD HPI: 70yo WM admitted via [facility] for A Fib.  Pt has all his healthcare performed by [place] in [place].  Says he was told renal fx was not normal about 4 months ago and lisinopril was DC'd.  Saw a nephrologist at the [place] who said renal fx was "fine" since the lisinopril was stopped?   Had CT scan of Rt shoulder done last Wed and ? If received IV contrast.  On thurs felt weak and SOB which persisted til he was seen at [facility].  He was found to be in A fib and then transferred here.  Feels a little better and SOB improved today.  Denies hx gross hematuria, kidney stones, FH renal ds or use of NSAID's.  CT scan here showed unremarkable kidneys.  Urine Na < 10.  Scr 2.8 yest and today 3.3.  UO, 175cc last night.    Past Medical History:  Diagnosis Date  . Cardiomyopathy (HCC)    EF 40% s/p dual chamber AICD  . CHF (congestive heart failure) (HCC)   . CKD (chronic kidney disease)    told by his PCP about 8 months ago and was seen by a nephrologist and his ACE I was stopped.    . Coronary artery disease 2012   s/p MI 2012 with PCI and 2 stents  . Diverticulitis   . Peripheral neuropathy     Past Surgical History:  Procedure Laterality Date  . APPENDECTOMY    . CARDIAC CATHETERIZATION    . CORONARY ANGIOPLASTY    . ELBOW SURGERY Left   . FINGER GANGLION CYST EXCISION    . INSERT / REPLACE / REMOVE PACEMAKER  2014   dual chamber AICD  . PARTIAL COLECTOMY    . ROTATOR CUFF REPAIR Left   . urinary bladder surgery      Family History  Problem Relation Age of Onset  . Dementia Mother   . Breast cancer Mother   . Cancer Father   . Parkinson's disease Sister    Social History:  reports that he quit smoking about 6 years ago. He has never used smokeless tobacco. He reports that he drinks about 4.2 oz of alcohol per week . He reports that he does not use drugs.  Allergies:   Allergies  Allergen Reactions  . Penicillins Other (See Comments)    Unknown    Medications Prior to Admission  Medication Sig Dispense Refill  . ASPERCREME LIDOCAINE EX Apply 1 application topically as needed (for pain).    Marland Kitchen aspirin EC 81 MG tablet Take 162 mg by mouth daily.     . clopidogrel (PLAVIX) 75 MG tablet Take 75 mg by mouth daily.    . Cyanocobalamin (VITAMIN B 12 PO) Take 100 mcg by mouth daily.    . DULoxetine (CYMBALTA) 30 MG capsule Take 90 mg by mouth daily.        Lab Results: UA: 0-5wbc, 0rbc, 100 protein.    Recent Labs  08/02/16 2157 08/03/16 0422 08/03/16 0727  WBC 10.4 8.4  --   HGB 12.9* 12.4*  --   HCT 38.8* 37.8*  --   PLT 108* 101* 95*   BMET  Recent Labs  08/02/16 2157 08/03/16 0422  NA 137 137  K 5.6* 5.5*  CL 105 106  CO2 21* 20*  GLUCOSE 106* 153*  BUN 55* 61*  CREATININE  2.79* 3.35*  CALCIUM 9.2 8.7*   LFT  Recent Labs  08/02/16 2157  PROT 6.8  ALBUMIN 4.0  AST 59*  ALT 88*  ALKPHOS 79  BILITOT 1.8*  BILIDIR 0.3  IBILI 1.5*   Dg Chest Port 1 View  Result Date: 08/03/2016 CLINICAL DATA:  Shortness of breath, history of CHF, cardiomyopathy, coronary artery disease, chronic renal insufficiency. EXAM: PORTABLE CHEST 1 VIEW COMPARISON:  None in PACs FINDINGS: The lungs are well-expanded. The interstitial markings are mildly prominent especially at the right lung base. The cardiac silhouette is enlarged. The pulmonary vascularity mildly engorged. The ICD is in stable position. There is calcification in the wall of the aortic arch. The bony thorax exhibits no acute abnormality. IMPRESSION: CHF with mild pulmonary interstitial edema. Minimal right infrahilar subsegmental atelectasis is suspected. Electronically Signed   By: David  SwazilandJordan M.D.   On: 08/03/2016 07:42   Ct Renal Stone Study  Result Date: 08/03/2016 CLINICAL DATA:  71 y/o M; right flank pain. History of chronic kidney disease, appendectomy, and partial colectomy.  EXAM: CT ABDOMEN AND PELVIS WITHOUT CONTRAST TECHNIQUE: Multidetector CT imaging of the abdomen and pelvis was performed following the standard protocol without IV contrast. COMPARISON:  None. FINDINGS: Lower chest: AICD lead in the right ventricle. Severe coronary artery calcification. Normal heart size. No pericardial effusion. Small right greater than left pleural effusions. Hepatobiliary: No focal liver abnormality is seen. No gallstones, gallbladder wall thickening, or biliary dilatation. Pancreas: There is indistinctness of fat surrounding the pancreatic and retroperitoneal edema greater on the right. Spleen: Normal in size without focal abnormality. Adrenals/Urinary Tract: Adrenal glands are unremarkable. Kidneys are normal, without renal calculi, focal lesion, or hydronephrosis. Bladder is unremarkable. Stomach/Bowel: Stomach is within normal limits. Appendix appears normal. No evidence of bowel wall thickening, distention, or inflammatory changes. Few sigmoid diverticular present. Colorectal anastomosis is patent without obstruction. Vascular/Lymphatic: Aortic atherosclerosis. No enlarged abdominal or pelvic lymph nodes. Reproductive: Prostate is unremarkable. Other: No abdominal wall hernia or abnormality. No abdominopelvic ascites. Musculoskeletal: Moderate spondylosis of the visible thoracolumbar spine. No acute osseous abnormality is evident. IMPRESSION: 1. Indistinctness of pancreatic head and mild nonspecific retroperitoneal edema may represent acute pancreatitis. Correlation with lipase recommended. 2. No hydronephrosis or urinary stone disease. 3. Mild sigmoid diverticulosis. No obstructive or inflammatory changes of bowel identified. 4. Small bilateral pleural effusions. Electronically Signed   By: Mitzi HansenLance  Furusawa-Stratton M.D.   On: 08/03/2016 03:02    ROS: Appetite decreased No CP SOB improved No abd pain No dysuria Neuropathic sxs hands and feet Chronic Rt shoulder pain  PHYSICAL  EXAM: Blood pressure (!) 121/93, pulse (!) 129, temperature 98.6 F (37 C), temperature source Oral, resp. rate (!) 23, weight 91.1 kg (200 lb 12.8 oz), SpO2 96 %. HEENT: PERRLA EOMI NECK:No JVD LUNGS:Decreased BS bases, basilar crackles CARDIAC: tachy, reg no MRG ABD:+ BS NTND No HSM  No bruits EXT:no edema NEURO:CNI Ox3 no asterixis  Assessment: 1. Acute vs acute on CKD.  Need to get info [facility].  If had IV contrast last week then this could be contrast nephropathy particularly in light of low UNa but he also has had some lowish BP with his A fib and this could explain ARF and low UNa. 2. A fib, now in sinus tach 3. Hx CAD 4. Thrombocytopenia.  No schistocytes seen on smear 5. Mild hyperkalemia  PLAN: 1. Get his medical records [facility] 2. Be cautious about hydration for right now.  I would  decrease rate to 50cc/hr 3. Daily Scr 4. Check SPEP in light of renal insuff and thrombocytopenia 5. Accurate I/O   Nameer Summer T 08/03/2016, 11:31 AM

## 2016-08-03 NOTE — Care Management Note (Addendum)
Case Management Note  Patient Details  Name: Jerry Baker MRN: [id number] Date of Birth: 05/25/1945  Subjective/Objective: Pt presented as a transfer from [facility] for SOB and A Fib RVR. Pt continues on IV Cardizem gtt. Pt lives in [place]. Per pt he has Medicare A only. No Rx drug Coverage. Medicare Card not on file. Per pt he will not provide because he wants the [place] to cover the cost. Pt has PCP @ the [facility]: Dr. Vevelyn Pat. RN is [employer] @ [phone] ext [phone]. Fax # to [employer] is [phone]. Pt states he gets his Rx's filled at this location. CM did reach out to the [place] to make them aware that pt is hospitalized.                  Action/Plan: Pt is aware that the [place] may not cover his hospital stay due to he opted to come to the nearest Hospital for treatment and not the [facility]. Staff RN to get health hx information faxed from the clinic. CM will continue to monitor for additional needs.   Expected Discharge Date:                  Expected Discharge Plan:  Home/Self Care  In-House Referral:     Discharge planning Services  CM Consult  Post Acute Care Choice:   N/A Choice offered to:   N/A  DME Arranged:   N/A DME Agency:   N/A  HH Arranged:   N/A HH Agency:   N/A  Status of Service: COMPLETED If discussed at Long Length of Stay Meetings, dates discussed:  08-10-16  Additional Comments: 1605 08-10-13 Tomi Bamberger, RN,BSN [phone] CM did speak with Jackquline Berlin RN in regards to Coumadin Clinic. PCP; Vevelyn Pat will need to f/u with the Coumadin Clinic to make sure this is the medication needs to be on. [facility] to [facility] for dosing. CM will not have a date that the patient will be seen. MD is aware- MD is speaking with the pharmacist in regards to changing to another anticoagulant. Decision was made to switch patient to Eliquis. 30 day free card provided and CM discussed to get medication from [facility] [address] to make  sure he gets it before returning to [place]. Pt will need a paper Rx for Eliquis. CM was able to schedule a hospital f/u for the patient- placed on AVS.     1410 08-10-16 Tomi Bamberger, RN,BSN [phone] CM did reach out to RN for Dr. Charlton Haws at [phone] in regards to lab draws. Dawn to contact this CM back in regards to coumadin clinic.    1552 08-09-16 Tomi Bamberger, RN,BSN [phone] Referral: New coumadin for AFib, needs INR checks/coumadin dosing. [facility]. [facility]- awaiting call back to see if can get scheduled.    1013 08-04-16 Tomi Bamberger, RN, BSN [phone] French Ana @  [place]: d/c planning needs to contact [phone]- CM to reach out to French Ana this am. French Ana was able to provide some additional information from notes:  Last Labs July 02, 2016 @ [place]: Cr 1.29, BUN 20. Sept 21 2017 Nephrology Consulted-Transient AKI unclear etiology possibly related to Lisinopril use. Ultrasound completed at this time. UA negative for protein and glucose. Cr 1.41. GFR 51 (2017), Electrolytes WNL. Hx with 2 previous reversible kidney Injuries in 2012 Cr 1.8 and Sept 2017. Information  to be faxed from [facility] today. CM will continue to monitor.        Gala LewandowskyGraves-Bigelow, Kynlei Piontek Kaye, RN 08/03/2016, 11:54 AM

## 2016-08-03 NOTE — Progress Notes (Addendum)
ANTICOAGULATION CONSULT NOTE - Pharmacy Consult for Heparin and start Coumadn Indication: atrial fibrillation  Allergies  Allergen Reactions  . Penicillins Other (See Comments)    Unknown    Patient Measurements: Weight: 200 lb 12.8 oz (91.1 kg)Weight = 91 kg   Vital Signs: Temp: 98.6 F (37 C) (05/08 0827) Temp Source: Oral (05/08 0827) BP: 108/91 (05/08 0827) Pulse Rate: 108 (05/08 0827)  Labs:  Recent Labs  08/02/16 2157 08/03/16 0422 08/03/16 0658 08/03/16 0727  HGB 12.9* 12.4*  --   --   HCT 38.8* 37.8*  --   --   PLT 108* 101*  --  95*  APTT  --   --   --  113*  LABPROT  --   --   --  19.1*  INR  --   --   --  1.59  HEPARINUNFRC  --   --  0.33  --   CREATININE 2.79* 3.35*  --   --   TROPONINI 0.23* 0.23*  --   --     CrCl cannot be calculated (Unknown ideal weight.).   Medical History: Past Medical History:  Diagnosis Date  . Cardiomyopathy (HCC)    EF 40% s/p dual chamber AICD  . CHF (congestive heart failure) (HCC)   . CKD (chronic kidney disease)    told by his PCP about 8 months ago and was seen by a nephrologist and his ACE I was stopped.    . Coronary artery disease 2012   s/p MI 2012 with PCI and 2 stents  . Diverticulitis   . Peripheral neuropathy      Assessment: [age] YOM transferred from [facility] with new onset Afib RVR and AKI. Pharmacy was consulted on 08/02/16 to manage heparin dosing.\ Baseline hgb 13.1, pltc 106K, INR 1.2 No oral anticoagulation PTA  Today the 8h HL =0.33 on 1200 units/hr IV heparin (=13 uts/kg/hr) INR 1.59 , Hgb low stable at 12.4, pltc low 108>95k D-Dimer 3.62 , troponin mildly elevated Cardiologist noted that with elevated SCr cannot have NOAC. Plans to start coumadin and continue heparin. No orders for coumadin or consult yet.   Goal of Therapy:  Heparin level 0.3-0.7 units/ml Monitor platelets by anticoagulation protocol: Yes   Plan:  Continue Heparin infusion 1200 units/hr  F/u 6hr HL to confirm   Daily heparin level and CBC f/u plans for oral anticoagulation-- cards noted plan to add warfarin.   Thank you for allowing pharmacy to be part of this patients care team. Jerry Baker, RPh Clinical Pharmacist Pager: [phone]-[phone] Main Pharmacy [phone] 08/03/2016,10:16 AM   ADDENDUM:  Pharmacy consulted to start Warfarin.  INR today is 1.59.  Baseline [facility] 1[facility]2 [facility]. CBC as noted above.   PLAN:  Warfarin 5 mg po today x1 Daily INR.   Jerry Baker, RPh Clinical Pharmacist Pager: [phone] 08/03/2016 11:17 AM

## 2016-08-03 NOTE — H&P (Addendum)
History and Physical    Jerry Baker WUJ:[id number] DOB: 03-23-1946 DOA: 08/02/2016  PCP: No primary care provider on file.  Patient coming from: Patient was transferred from [facility].  Chief Complaint: Shortness of breath.  HPI: Jerry Baker is a 71 y.o. male with history of CAD status post stenting, cardiomyopathy last EF measured was 40% with AICD and pacemaker placement, peripheral neuropathy presented to the ER at [facility] with complaints of shortness of breath. Patient states that last week around Wednesday 6 days ago patient had right shoulder CAT scan done for rotator cuff with IV contrast. Following which patient was feeling uncomfortable and gradually was getting more and more short of breath. Because the shortness of breath is getting worse even to the ER. In the ER patient was found to be in A. fib with RVR. Troponin was mildly elevated. Patient's creatinine also was 2.6. No old labs to compare. UA still pending. Patient was transferred to [facility] for the need of cardiology consult. Patient states this morning he also had one episode of nausea vomiting and started experiencing right flank pain.  ED Course: Patient was started on heparin and Cardizem infusion. Chest x-ray did not show anything acute. EKG shows A. fib with RVR.  Review of Systems: As per HPI, rest all negative.   Past Medical History:  Diagnosis Date  . Cardiomyopathy (HCC)    EF 40% s/p dual chamber AICD  . CHF (congestive heart failure) (HCC)   . CKD (chronic kidney disease)    told by his PCP about 8 months ago and was seen by a nephrologist and his ACE I was stopped.    . Coronary artery disease 2012   s/p MI 2012 with PCI and 2 stents  . Diverticulitis   . Peripheral neuropathy     Past Surgical History:  Procedure Laterality Date  . APPENDECTOMY    . CARDIAC CATHETERIZATION    . CORONARY ANGIOPLASTY    . ELBOW SURGERY Left   . FINGER GANGLION CYST EXCISION    .  INSERT / REPLACE / REMOVE PACEMAKER  2014   dual chamber AICD  . PARTIAL COLECTOMY    . ROTATOR CUFF REPAIR Left   . urinary bladder surgery       reports that he quit smoking about 6 years ago. He has never used smokeless tobacco. He reports that he drinks about 4.2 oz of alcohol per week . He reports that he does not use drugs.  Allergies  Allergen Reactions  . Penicillins     Family History  Problem Relation Age of Onset  . Dementia Mother   . Breast cancer Mother   . Cancer Father   . Parkinson's disease Sister     Prior to Admission medications   Medication Sig Start Date End Date Taking? Authorizing Provider  aspirin EC 81 MG tablet Take 81 mg by mouth daily.   Yes [provider]  clopidogrel (PLAVIX) 75 MG tablet Take 75 mg by mouth daily.   Yes [provider]  Cyanocobalamin (VITAMIN B 12 PO) Take 100 mcg by mouth daily.   Yes [provider]  DULoxetine (CYMBALTA) 20 MG capsule Take 30 mg by mouth daily.    Yes [provider]    Physical Exam: Vitals:   08/02/16 2247 08/02/16 2300 08/03/16 0004 08/03/16 0013  BP:  97/82 92/80 102/85  Pulse: (!) 140     Resp:  (!) 28 15 20   Temp:  TempSrc:      SpO2:          Constitutional: Moderately built and nourished. Vitals:   08/02/16 2247 08/02/16 2300 08/03/16 0004 08/03/16 0013  BP:  97/82 92/80 102/85  Pulse: (!) 140     Resp:  (!) 28 15 20   Temp:      TempSrc:      SpO2:       Eyes: Anicteric. no pallor. ENMT: No discharge from the ears eyes nose and mouth. Neck: No masses. No JVD appreciated. Respiratory: No rhonchi or crepitations. Cardiovascular: S1-S2 heard no murmurs appreciated. Abdomen: Soft nontender bowel sounds present. Musculoskeletal: No edema. No joint effusion. Skin: No rash. Skin appears warm. Neurologic: Alert awake oriented to time place and person. Moves all extremities. Psychiatric: Appears normal. Normal affect.   Labs on Admission: I  have personally reviewed following labs and imaging studies  CBC:  Recent Labs Lab 08/02/16 2157  WBC 10.4  NEUTROABS 7.4  HGB 12.9*  HCT 38.8*  MCV 95.8  PLT 108*   Basic Metabolic Panel:  Recent Labs Lab 08/02/16 2157  NA 137  K 5.6*  CL 105  CO2 21*  GLUCOSE 106*  BUN 55*  CREATININE 2.79*  CALCIUM 9.2   GFR: CrCl cannot be calculated (Unknown ideal weight.). Liver Function Tests:  Recent Labs Lab 08/02/16 2157  AST 59*  ALT 88*  ALKPHOS 79  BILITOT 1.8*  PROT 6.8  ALBUMIN 4.0   No results for input(s): LIPASE, AMYLASE in the last 168 hours. No results for input(s): AMMONIA in the last 168 hours. Coagulation Profile: No results for input(s): INR, PROTIME in the last 168 hours. Cardiac Enzymes:  Recent Labs Lab 08/02/16 2157  TROPONINI 0.23*   BNP (last 3 results) No results for input(s): PROBNP in the last 8760 hours. HbA1C: No results for input(s): HGBA1C in the last 72 hours. CBG: No results for input(s): GLUCAP in the last 168 hours. Lipid Profile:  Recent Labs  08/02/16 2157  CHOL 145  HDL 47  LDLCALC 86  TRIG 59  CHOLHDL 3.1   Thyroid Function Tests:  Recent Labs  08/02/16 2157  TSH 6.757*   Anemia Panel: No results for input(s): VITAMINB12, FOLATE, FERRITIN, TIBC, IRON, RETICCTPCT in the last 72 hours. Urine analysis:    Component Value Date/Time   COLORURINE AMBER (A) 08/02/2016 2340   APPEARANCEUR CLOUDY (A) 08/02/2016 2340   LABSPEC 1.025 08/02/2016 2340   PHURINE 5.0 08/02/2016 2340   GLUCOSEU NEGATIVE 08/02/2016 2340   HGBUR NEGATIVE 08/02/2016 2340   BILIRUBINUR NEGATIVE 08/02/2016 2340   KETONESUR 5 (A) 08/02/2016 2340   PROTEINUR 100 (A) 08/02/2016 2340   NITRITE NEGATIVE 08/02/2016 2340   LEUKOCYTESUR NEGATIVE 08/02/2016 2340   Sepsis Labs: @LABRCNTIP (procalcitonin:4,lacticidven:4) )No results found for this or any previous visit (from the past 240 hour(s)).   Radiological Exams on Admission: No  results found.  EKG: Independently reviewed. A. fib with RVR.  Assessment/Plan Principal Problem:   Atrial fibrillation with RVR (HCC) Active Problems:   Cardiomyopathy (HCC)   Diverticulitis   Peripheral neuropathy   Coronary artery disease involving native coronary artery of native heart without angina pectoris   AKI (acute kidney injury) (HCC)   Elevated troponin    1. A. fib with RVR - appreciate cardiology consultation is on Cardizem infusion and heparin. Chads 2 vasc score is more than 2. Check TSH, cycle cardiac markers check 2-D echo. Patient has been placed on Coreg. 2. Renal failure  probably acute on chronic given the anemia - no old labs to compare. Check urinalysis and FENa and urine eosinophils. Since patient has had since exposure to IV contrast which could be contributing to patient's renal failure. Patient at this time is making urine. 3. Nausea vomiting with right flank pain - I have ordered CT renal studies. Check LFTs and UA lipase. If patient has persistent vomiting will keep patient nothing by mouth. 4. Thrombocytopenia - probably secondary to alcoholism. But since patient has renal failure will check LDH to rule out any hemolytic process. 5. Alcoholism - will keep patient on CIWA protocol.  6. Cardiomyopathy status post AICD and pacemaker placement.   DVT prophylaxis: Heparin. Code Status: Full code.  Family Communication: Discussed with patient.  Disposition Plan: Home.  Consults called: Cardiology. Nephrology. Hematology.  Admission status: Inpatient.    Eduard ClosKAKRAKANDY,Abrahm Mancia N. MD [facility] Pager [phone].  If 7PM-7AM, please contact night-coverage [email or web address] Password [email or web address]  08/03/2016, 12:57 AM

## 2016-08-03 NOTE — Consult Note (Signed)
[facility] CONSULT NOTE  No care team member to display  CHIEF COMPLAINTS/PURPOSE OF CONSULTATION:  Abnormal CBC, high LDH  HISTORY OF PRESENTING ILLNESS:  Jerry Baker 71 y.o. male is here due to progressive shortness of breath and evidence of congestive heart failure. We do not have his baseline blood work. This patient have background history of cardiomyopathy and was admitted/transferred from an outside facility after presentation with acute renal failure, worsening shortness of breath, atrial fibrillation with rapid ventricular response and other abnormal blood work. He also have significant nausea and vomiting. Blood work last evening show evidence of acute renal failure, elevated bilirubin, abnormal liver enzymes and elevated troponin. Repeat blood work show worsening renal failure, significant elevated troponin, BNP, LDH with near normal workup for anemia. Admission CBC showed white count of 10.4, hemoglobin 12.9 and platelet count of 108 Subsequent repeat CBC came back white count 8.4, hemoglobin 12.4 and platelet count of 101. TSH was abnormal CT imaging show evidence of acute pancreatitis.  The patient denies excessive alcohol intake, only 1-2 beers per day. Echocardiogram is pending He is placed on anticoagulation therapy due to abnormal cardiac enzymes, suspicious for myocardial injury, with atrial fibrillation with rapid ventricular response He is quite short of breath talking to me.  He is a most of his symptoms started when he had CT scan with contrast recently.  He has 4 pillow orthopnea.  Denies cough. His nausea and vomiting has improved.  MEDICAL HISTORY:  Past Medical History:  Diagnosis Date  . Cardiomyopathy (HCC)    EF 40% s/p dual chamber AICD  . CHF (congestive heart failure) (HCC)   . CKD (chronic kidney disease)    told by his PCP about 8 months ago and was seen by a nephrologist and his ACE I was stopped.    . Coronary artery disease 2012    s/p MI 2012 with PCI and 2 stents  . Diverticulitis   . Peripheral neuropathy     SURGICAL HISTORY: Past Surgical History:  Procedure Laterality Date  . APPENDECTOMY    . CARDIAC CATHETERIZATION    . CORONARY ANGIOPLASTY    . ELBOW SURGERY Left   . FINGER GANGLION CYST EXCISION    . INSERT / REPLACE / REMOVE PACEMAKER  2014   dual chamber AICD  . PARTIAL COLECTOMY    . ROTATOR CUFF REPAIR Left   . urinary bladder surgery      SOCIAL HISTORY: Social History   Social History  . Marital status: N/A    Spouse name: N/A  . Number of children: N/A  . Years of education: N/A   Occupational History  . Not on file.   Social History Main Topics  . Smoking status: Former Smoker    Quit date: 08/03/2010  . Smokeless tobacco: Never Used  . Alcohol use 4.2 oz/week    7 Cans of beer per week  . Drug use: No  . Sexual activity: Not on file   Other Topics Concern  . Not on file   Social History Narrative  . No narrative on file    FAMILY HISTORY: Family History  Problem Relation Age of Onset  . Dementia Mother   . Breast cancer Mother   . Cancer Father   . Parkinson's disease Sister     ALLERGIES:  is allergic to penicillins.  MEDICATIONS:  Current Facility-Administered Medications  Medication Dose Route Frequency Provider Last Rate Last Dose  . 0.9 %  sodium chloride infusion  Intravenous Continuous Primitivo GauzeMattingly, Michael, MD   Stopped at 08/03/16 1500  . 0.9 %  sodium chloride infusion   Intravenous Continuous Wendall StadeNishan, Peter C, MD 20 mL/hr at 08/03/16 [id number]    . acetaminophen (TYLENOL) tablet 650 mg  650 mg Oral Q6H PRN Eduard ClosKakrakandy, Arshad N, MD       Or  . acetaminophen (TYLENOL) suppository 650 mg  650 mg Rectal Q6H PRN Eduard ClosKakrakandy, Arshad N, MD      . atorvastatin (LIPITOR) tablet 80 mg  80 mg Oral q1800 Quintella Reicherturner, Traci R, MD   80 mg at 08/03/16 1759  . clopidogrel (PLAVIX) tablet 75 mg  75 mg Oral Daily Quintella Reicherturner, Traci R, MD   75 mg at 08/03/16 0948  . diltiazem  (CARDIZEM) tablet 30 mg  30 mg Oral Q6H Bhagat, Bhavinkumar, PA      . DULoxetine (CYMBALTA) DR capsule 90 mg  90 mg Oral Daily Eduard ClosKakrakandy, Arshad N, MD   90 mg at 08/03/16 0949  . folic acid (FOLVITE) tablet 1 mg  1 mg Oral Daily Eduard ClosKakrakandy, Arshad N, MD   1 mg at 08/03/16 0949  . heparin ADULT infusion 100 units/mL (25000 units/26050mL sodium chloride 0.45%)  1,200 Units/hr Intravenous Continuous Robinette HainesBell, Mei W, RPH 12 mL/hr at 08/03/16 1501 1,200 Units/hr at 08/03/16 1501  . LORazepam (ATIVAN) tablet 1 mg  1 mg Oral Q6H PRN Eduard ClosKakrakandy, Arshad N, MD       Or  . LORazepam (ATIVAN) injection 1 mg  1 mg Intravenous Q6H PRN Eduard ClosKakrakandy, Arshad N, MD      . metoprolol (LOPRESSOR) tablet 50 mg  50 mg Oral BID Wendall StadeNishan, Peter C, MD      . multivitamin with minerals tablet 1 tablet  1 tablet Oral Daily Eduard ClosKakrakandy, Arshad N, MD   1 tablet at 08/03/16 [phone]  . promethazine (PHENERGAN) injection 12.5 mg  12.5 mg Intravenous Q6H PRN Eduard ClosKakrakandy, Arshad N, MD   12.5 mg at 08/03/16 0730  . thiamine (VITAMIN B-1) tablet 100 mg  100 mg Oral Daily Eduard ClosKakrakandy, Arshad N, MD   100 mg at 08/03/16 1021   Or  . thiamine (B-1) injection 100 mg  100 mg Intravenous Daily Eduard ClosKakrakandy, Arshad N, MD      . Warfarin - Pharmacist Dosing Inpatient   Does not apply q1800 Berton BonHammond, Janine, NP        REVIEW OF SYSTEMS:   Constitutional: Denies fevers, chills or abnormal night sweats Eyes: Denies blurriness of vision, double vision or watery eyes Ears, nose, mouth, throat, and face: Denies mucositis or sore throat Cardiovascular: Denies palpitation, chest discomfort or lower extremity swelling Skin: Denies abnormal skin rashes Lymphatics: Denies new lymphadenopathy or easy bruising Neurological:Denies numbness, tingling or new weaknesses Behavioral/Psych: Mood is stable, no new changes  All other systems were reviewed with the patient and are negative.  PHYSICAL EXAMINATION: ECOG PERFORMANCE STATUS: 2 - Symptomatic, <50% confined to  bed  Vitals:   08/03/16 1530 08/03/16 1700  BP: 114/73 115/87  Pulse: 87 86  Resp: (!) 22 (!) 22  Temp:     Filed Weights   08/03/16 0613  Weight: 200 lb 12.8 oz (91.1 kg)    GENERAL:alert, no distress and comfortable.  He has dyspnea just talking SKIN: Noted skin bruises EYES: normal, conjunctiva are pink and non-injected, sclera clear OROPHARYNX:no exudate, no erythema and lips, buccal mucosa, and tongue normal  NECK: supple, thyroid normal size, non-tender, without nodularity LYMPH:  no palpable lymphadenopathy in the cervical, axillary or  inguinal LUNGS: clear to auscultation and percussion with normal breathing effort HEART: Irregular rate and rhythm, no murmurs and no lower extremity edema ABDOMEN:abdomen soft, non-tender and normal bowel sounds Musculoskeletal:no cyanosis of digits and no clubbing  PSYCH: alert & oriented x 3 with fluent speech NEURO: no focal motor/sensory deficits  LABORATORY DATA:  I have reviewed the data as listed Lab Results  Component Value Date   WBC 8.4 08/03/2016   HGB 12.4 (L) 08/03/2016   HCT 37.8 (L) 08/03/2016   MCV 95.2 08/03/2016   PLT 95 (L) 08/03/2016    Recent Labs  08/02/16 2157 08/03/16 0422  NA 137 137  K 5.6* 5.5*  CL 105 106  CO2 21* 20*  GLUCOSE 106* 153*  BUN 55* 61*  CREATININE 2.79* 3.35*  CALCIUM 9.2 8.7*  GFRNONAA 21* 17*  GFRAA 25* 20*  PROT 6.8  --   ALBUMIN 4.0  --   AST 59*  --   ALT 88*  --   ALKPHOS 79  --   BILITOT 1.8*  --   BILIDIR 0.3  --   IBILI 1.5*  --    I have reviewed his peripheral blood smear.  Absolute thrombocytopenia is seen.  No schistocytes.  No platelet clumping. WBC of normal morphology.  Occasional large platelets are seen  RADIOGRAPHIC STUDIES: I have personally reviewed the radiological images as listed and agreed with the findings in the report. Dg Chest Port 1 View  Result Date: 08/03/2016 CLINICAL DATA:  Shortness of breath, history of CHF, cardiomyopathy, coronary  artery disease, chronic renal insufficiency. EXAM: PORTABLE CHEST 1 VIEW COMPARISON:  None in PACs FINDINGS: The lungs are well-expanded. The interstitial markings are mildly prominent especially at the right lung base. The cardiac silhouette is enlarged. The pulmonary vascularity mildly engorged. The ICD is in stable position. There is calcification in the wall of the aortic arch. The bony thorax exhibits no acute abnormality. IMPRESSION: CHF with mild pulmonary interstitial edema. Minimal right infrahilar subsegmental atelectasis is suspected. Electronically Signed   By: David  [place] M.D.   On: 08/03/2016 07:42   Ct Renal Stone Study  Result Date: 08/03/2016 CLINICAL DATA:  71 y/o M; right flank pain. History of chronic kidney disease, appendectomy, and partial colectomy. EXAM: CT ABDOMEN AND PELVIS WITHOUT CONTRAST TECHNIQUE: Multidetector CT imaging of the abdomen and pelvis was performed following the standard protocol without IV contrast. COMPARISON:  None. FINDINGS: Lower chest: AICD lead in the right ventricle. Severe coronary artery calcification. Normal heart size. No pericardial effusion. Small right greater than left pleural effusions. Hepatobiliary: No focal liver abnormality is seen. No gallstones, gallbladder wall thickening, or biliary dilatation. Pancreas: There is indistinctness of fat surrounding the pancreatic and retroperitoneal edema greater on the right. Spleen: Normal in size without focal abnormality. Adrenals/Urinary Tract: Adrenal glands are unremarkable. Kidneys are normal, without renal calculi, focal lesion, or hydronephrosis. Bladder is unremarkable. Stomach/Bowel: Stomach is within normal limits. Appendix appears normal. No evidence of bowel wall thickening, distention, or inflammatory changes. Few sigmoid diverticular present. Colorectal anastomosis is patent without obstruction. Vascular/Lymphatic: Aortic atherosclerosis. No enlarged abdominal or pelvic lymph nodes.  Reproductive: Prostate is unremarkable. Other: No abdominal wall hernia or abnormality. No abdominopelvic ascites. Musculoskeletal: Moderate spondylosis of the visible thoracolumbar spine. No acute osseous abnormality is evident. IMPRESSION: 1. Indistinctness of pancreatic head and mild nonspecific retroperitoneal edema may represent acute pancreatitis. Correlation with lipase recommended. 2. No hydronephrosis or urinary stone disease. 3. Mild sigmoid diverticulosis. No obstructive or  inflammatory changes of bowel identified. 4. Small bilateral pleural effusions. Electronically Signed   By: Mitzi Hansen M.D.   On: 08/03/2016 03:02    ASSESSMENT & PLAN:  Mild anemia Likely due to anemia chronic illness.  Observe  Acute thrombocytopenia Cause unknown, could be due to recent alcoholism, pancreatitis or liver disease Peripheral blood smear did not reveal any evidence of schistocytes.  Platelet clumping is not seen. Imaging study revealed evidence of pancreatitis.   There is no contraindication for him to receive IV heparin or antiplatelet agents as indicated, as long as platelet count is greater than 50,000  Elevated liver enzymes Recent alcoholism Pancreatitis on CT imaging Monitor liver function carefully  Elevated LDH Likely related to myocardial injury and renal failure I do not believe this represented hemolysis  Cardiomyopathy, heart failure We will defer to primary service. Echocardiogram is pending Continue aggressive medical management  Acute renal failure Could be due to contrast nephropathy I will add uric acid Nephrologist is following  Discharge planning Not ready for discharge We will follow  All questions were answered. The patient knows to call the clinic with any problems, questions or concerns.    Artis Delay, MD 08/03/2016 6:49 PM

## 2016-08-03 NOTE — Progress Notes (Signed)
Pt was nauseous and vomited x3 this shift. On call notified and order for Phenergan given. Pt now resting in bed with no complaints of nausea/vomiting at this time. Also, pt became hypotensive during the night while on Cardizem and it was stopped. MD aware and order for bolus of NS was given x2. Pt only voided a total of this shift. Pt bladder scanned at 0730 and showed 15 ml. Pt now in ST rhythm at rate of 125 bpm and BP 108/91. On coming nurse aware of pt condition and will continue to monitor pt.

## 2016-08-03 NOTE — Progress Notes (Addendum)
Trop 0.23. Pt also nauseous and vomited once after eating. Cardiology paged. Requested antiemetic. No cp, last BP 102/85, HR 102, Afebrile, O2 97%.

## 2016-08-03 NOTE — Progress Notes (Signed)
Converted to sinus on IV cardizem 50mg  /hr. Will discontinue IV cardizem and start short acting 30mg  q 6 hours. Plan to switch to Cardizem CD 120mg  tomorrow morning if rate stable.

## 2016-08-04 ENCOUNTER — Inpatient Hospital Stay (HOSPITAL_COMMUNITY): Payer: Non-veteran care

## 2016-08-04 ENCOUNTER — Other Ambulatory Visit (HOSPITAL_COMMUNITY): Payer: Self-pay

## 2016-08-04 LAB — PROTEIN ELECTROPHORESIS, SERUM
A/G Ratio: 1.4 (ref 0.7–1.7)
ALBUMIN ELP: 3.6 g/dL (ref 2.9–4.4)
ALPHA-1-GLOBULIN: 0.2 g/dL (ref 0.0–0.4)
ALPHA-2-GLOBULIN: 0.6 g/dL (ref 0.4–1.0)
BETA GLOBULIN: 0.9 g/dL (ref 0.7–1.3)
GAMMA GLOBULIN: 0.9 g/dL (ref 0.4–1.8)
Globulin, Total: 2.6 g/dL (ref 2.2–3.9)
M-Spike, %: 0.3 g/dL — ABNORMAL HIGH
Total Protein ELP: 6.2 g/dL (ref 6.0–8.5)

## 2016-08-04 LAB — CBC
HEMATOCRIT: 37.7 % — AB (ref 39.0–52.0)
HEMOGLOBIN: 12.3 g/dL — AB (ref 13.0–17.0)
MCH: 31.5 pg (ref 26.0–34.0)
MCHC: 32.6 g/dL (ref 30.0–36.0)
MCV: 96.7 fL (ref 78.0–100.0)
Platelets: 118 10*3/uL — ABNORMAL LOW (ref 150–400)
RBC: 3.9 MIL/uL — AB (ref 4.22–5.81)
RDW: 13.7 % (ref 11.5–15.5)
WBC: 11.8 10*3/uL — AB (ref 4.0–10.5)

## 2016-08-04 LAB — BASIC METABOLIC PANEL
Anion gap: 13 (ref 5–15)
BUN: 70 mg/dL — AB (ref 6–20)
CALCIUM: 8.4 mg/dL — AB (ref 8.9–10.3)
CHLORIDE: 103 mmol/L (ref 101–111)
CO2: 20 mmol/L — ABNORMAL LOW (ref 22–32)
CREATININE: 3.86 mg/dL — AB (ref 0.61–1.24)
GFR calc non Af Amer: 14 mL/min — ABNORMAL LOW (ref >60)
GFR, EST AFRICAN AMERICAN: 17 mL/min — AB (ref >60)
Glucose, Bld: 119 mg/dL — ABNORMAL HIGH (ref 65–99)
Potassium: 4.7 mmol/L (ref 3.5–5.1)
SODIUM: 136 mmol/L (ref 135–145)

## 2016-08-04 LAB — HEPATIC FUNCTION PANEL
ALBUMIN: 3.7 g/dL (ref 3.5–5.0)
ALK PHOS: 89 U/L (ref 38–126)
ALT: 938 U/L — ABNORMAL HIGH (ref 17–63)
AST: 709 U/L — ABNORMAL HIGH (ref 15–41)
BILIRUBIN DIRECT: 0.5 mg/dL (ref 0.1–0.5)
BILIRUBIN INDIRECT: 1.5 mg/dL — AB (ref 0.3–0.9)
Total Bilirubin: 2 mg/dL — ABNORMAL HIGH (ref 0.3–1.2)
Total Protein: 6.3 g/dL — ABNORMAL LOW (ref 6.5–8.1)

## 2016-08-04 LAB — RENAL FUNCTION PANEL
Albumin: 3.7 g/dL (ref 3.5–5.0)
Anion gap: 12 (ref 5–15)
BUN: 70 mg/dL — ABNORMAL HIGH (ref 6–20)
CALCIUM: 8.3 mg/dL — AB (ref 8.9–10.3)
CO2: 21 mmol/L — ABNORMAL LOW (ref 22–32)
CREATININE: 3.75 mg/dL — AB (ref 0.61–1.24)
Chloride: 103 mmol/L (ref 101–111)
GFR, EST AFRICAN AMERICAN: 17 mL/min — AB (ref >60)
GFR, EST NON AFRICAN AMERICAN: 15 mL/min — AB (ref >60)
Glucose, Bld: 118 mg/dL — ABNORMAL HIGH (ref 65–99)
Phosphorus: 7.3 mg/dL — ABNORMAL HIGH (ref 2.5–4.6)
Potassium: 4.8 mmol/L (ref 3.5–5.1)
Sodium: 136 mmol/L (ref 135–145)

## 2016-08-04 LAB — BLOOD GAS, ARTERIAL
ACID-BASE DEFICIT: 8.1 mmol/L — AB (ref 0.0–2.0)
BICARBONATE: 17 mmol/L — AB (ref 20.0–28.0)
Drawn by: 419771
O2 CONTENT: 3 L/min
O2 SAT: 92.7 %
PATIENT TEMPERATURE: 98.6
PH ART: 7.303 — AB (ref 7.350–7.450)
pCO2 arterial: 35.5 mmHg (ref 32.0–48.0)
pO2, Arterial: 78.1 mmHg — ABNORMAL LOW (ref 83.0–108.0)

## 2016-08-04 LAB — HEPARIN LEVEL (UNFRACTIONATED): HEPARIN UNFRACTIONATED: 0.45 [IU]/mL (ref 0.30–0.70)

## 2016-08-04 LAB — KAPPA/LAMBDA LIGHT CHAINS
Kappa free light chain: 38.7 mg/L — ABNORMAL HIGH (ref 3.3–19.4)
Kappa, lambda light chain ratio: 1.15 (ref 0.26–1.65)
Lambda free light chains: 33.6 mg/L — ABNORMAL HIGH (ref 5.7–26.3)

## 2016-08-04 LAB — TROPONIN I: TROPONIN I: 0.19 ng/mL — AB (ref <0.03)

## 2016-08-04 LAB — PROTIME-INR
INR: 1.78
Prothrombin Time: 21 seconds — ABNORMAL HIGH (ref 11.4–15.2)

## 2016-08-04 LAB — URIC ACID: Uric Acid, Serum: 11.7 mg/dL — ABNORMAL HIGH (ref 4.4–7.6)

## 2016-08-04 MED ORDER — COUMADIN BOOK
Freq: Once | Status: AC
  Administered 2016-08-04: 12:00:00
  Filled 2016-08-04: qty 1

## 2016-08-04 MED ORDER — WARFARIN VIDEO
Freq: Once | Status: AC
  Administered 2016-08-04: 12:00:00

## 2016-08-04 MED ORDER — DILTIAZEM HCL ER COATED BEADS 120 MG PO CP24
120.0000 mg | ORAL_CAPSULE | Freq: Every day | ORAL | Status: DC
  Administered 2016-08-04 – 2016-08-10 (×7): 120 mg via ORAL
  Filled 2016-08-04 (×7): qty 1

## 2016-08-04 MED ORDER — FUROSEMIDE 10 MG/ML IJ SOLN
20.0000 mg | Freq: Once | INTRAMUSCULAR | Status: AC
  Administered 2016-08-04: 20 mg via INTRAVENOUS
  Filled 2016-08-04: qty 2

## 2016-08-04 MED ORDER — WARFARIN SODIUM 2 MG PO TABS
2.0000 mg | ORAL_TABLET | Freq: Once | ORAL | Status: AC
  Administered 2016-08-04: 2 mg via ORAL
  Filled 2016-08-04: qty 1

## 2016-08-04 MED ORDER — FUROSEMIDE 10 MG/ML IJ SOLN
120.0000 mg | Freq: Two times a day (BID) | INTRAVENOUS | Status: DC
  Administered 2016-08-04 – 2016-08-05 (×2): 120 mg via INTRAVENOUS
  Filled 2016-08-04 (×6): qty 12

## 2016-08-04 MED ORDER — MORPHINE SULFATE (PF) 2 MG/ML IV SOLN
1.0000 mg | Freq: Once | INTRAVENOUS | Status: AC
  Administered 2016-08-04: 1 mg via INTRAVENOUS
  Filled 2016-08-04: qty 1

## 2016-08-04 NOTE — Progress Notes (Addendum)
PROGRESS NOTE    Jerry Baker  WUJ:[id number] DOB: 25-Mar-1946 DOA: 08/02/2016 PCP: No primary care provider on file.    Brief Narrative: [name] a 71 y.o.malewith history of CAD status post stenting, cardiomyopathy last EF measured was 40% with AICD and pacemaker placement, peripheral neuropathy presented to the ER at [facility] with complaints of shortness of breath. He was found to be in afib with RVR, acute renal failure and in acute pancreatitis.   Assessment & Plan:   Principal Problem:   Atrial fibrillation with RVR (HCC) Active Problems:   Cardiomyopathy (HCC)   Diverticulitis   Peripheral neuropathy   Coronary artery disease involving native coronary artery of native heart without angina pectoris   AKI (acute kidney injury) (HCC)   Elevated troponin   Atrial fibrillation with RVR:  Admitted to step down, started on IV heparin, cardizem and coumadin started.  Cardiology consulted and echocardiogram ordered, pending.    Acute kidney Injury:  Unclear etiology, possibly pre renal, vs contrast induced.  Nephrology on board.    Acute respiratory failure secondary to pulmonary edema:  Two doses of IV lasix ordered, without much urine output.  Foley , strict intake and out put.  Lasix gtt and monitor urine output.  Awaiting records from [place].  Nephrology consulted and recommendations given.    Alcohol abuse: on CIWA . NO withdrawal symptoms.    Elevated liver function tests:  ? Alcohol hepatitis vs passive congestion from CHF.  US abdomen shows some gall bladder sludge and CBD at 0.9 cm, no stones or evidence of obstruction.  He reports his abdominal discomfort has improved, requesting food.  GI consulted for evaluation of elevated lfts's   Acute mild pancreatitis appears to be improving, lipase levels wnl.  And his abd pain is improving.  Started on clears and advance as tolerated.   Elevated troponin's: demand ischemia from pulmonary edema.     Mild anemia, thrombocytopenia: elevated LDH.  Monitor.   h/o DCM with AICD;  Fluid overloaded, on IV lasix.  Repeat echocardiogram for evaluation of LVEF.    DVT prophylaxis: heparin gtt.  Code Status: full code.  Family Communication:  Disposition Plan: (specify when and where you expect patient to be discharged). Include barriers to DC in this tab.   Consultants:   Cardiology  nephrology  Procedures: echocardiogram  US abdomen.    Antimicrobials: none    Subjective:  abd pain is better. Breathing is better than las tnight.  Overnight, pt became sob and required BIPAP and NRB. Currently on 3 lit of [place] oxygen and better.  Objective: Vitals:   08/04/16 0515 08/04/16 0755 08/04/16 0852 08/04/16 1109  BP: 114/77 111/76 131/89 115/84  Pulse: 74 75  68  Resp: 19 18  (!) 21  Temp:  97.8 F (36.6 C)  97.7 F (36.5 C)  TempSrc:  Oral  Oral  SpO2: 97% 94%  100%  Weight:      Height:    5\' 11"  (1.803 m)    Intake/Output Summary (Last 24 hours) at 08/04/16 1326 Last data filed at 08/04/16 0700  Gross per 24 hour  Intake           207.16 ml  Output              550 ml  Net          -342.84 ml   Filed Weights   08/03/16 0613  Weight: 91.1 kg (200 lb 12.8 oz)    Examination:  General exam: Appears uncomfortable.  Respiratory system: Clear to auscultation. Respiratory effort normal. Cardiovascular system: S1 & S2 heard, RRR. No JVD, murmurs, rubs, gallops or clicks. Trace pedal edema.  Gastrointestinal system: Abdomen is nondistended,soft and mildly tender. . No organomegaly or masses felt. Normal bowel sounds heard. Central nervous system: Alert and oriented. No focal neurological deficits. Extremities: Symmetric 5 x 5 power. Skin: No rashes, lesions or ulcers Psychiatry: Judgement and insight appear normal. Flat affect and mood.      Data Reviewed: I have personally reviewed following labs and imaging studies  CBC:  Recent Labs Lab 08/02/16 2157  08/03/16 0422 08/03/16 0727 08/04/16 0231  WBC 10.4 8.4  --  11.8*  NEUTROABS 7.4  --   --   --   HGB 12.9* 12.4*  --  12.3*  HCT 38.8* 37.8*  --  37.7*  MCV 95.8 95.2  --  96.7  PLT 108* 101* 95* 118*   Basic Metabolic Panel:  Recent Labs Lab 08/02/16 2157 08/03/16 0422 08/04/16 0231  NA 137 137 136  136  K 5.6* 5.5* 4.8  4.7  CL 105 106 103  103  CO2 21* 20* 21*  20*  GLUCOSE 106* 153* 118*  119*  BUN 55* 61* 70*  70*  CREATININE 2.79* 3.35* 3.75*  3.86*  CALCIUM 9.2 8.7* 8.3*  8.4*  MG  --  2.4  --   PHOS  --   --  7.3*   GFR: Estimated Creatinine Clearance: 20.9 mL/min (A) (by C-G formula based on SCr of 3.75 mg/dL (H)). Liver Function Tests:  Recent Labs Lab 08/02/16 2157 08/04/16 0231 08/04/16 0600  AST 59*  --  709*  ALT 88*  --  938*  ALKPHOS 79  --  89  BILITOT 1.8*  --  2.0*  PROT 6.8  --  6.3*  ALBUMIN 4.0 3.7 3.7    Recent Labs Lab 08/03/16 0539  LIPASE 21   No results for input(s): AMMONIA in the last 168 hours. Coagulation Profile:  Recent Labs Lab 08/03/16 0727 08/04/16 0231  INR 1.59 1.78   Cardiac Enzymes:  Recent Labs Lab 08/02/16 2157 08/03/16 0422 08/03/16 0932 08/04/16 0323  TROPONINI 0.23* 0.23* 0.17* 0.19*   BNP (last 3 results) No results for input(s): PROBNP in the last 8760 hours. HbA1C: No results for input(s): HGBA1C in the last 72 hours. CBG: No results for input(s): GLUCAP in the last 168 hours. Lipid Profile:  Recent Labs  08/02/16 2157  CHOL 145  HDL 47  LDLCALC 86  TRIG 59  CHOLHDL 3.1   Thyroid Function Tests:  Recent Labs  08/02/16 2157  TSH 6.757*   Anemia Panel:  Recent Labs  08/03/16 0539  VITAMINB12 5,493*  FOLATE 59.7  FERRITIN 1,209*  TIBC 318  IRON 129  RETICCTPCT 1.6   Sepsis Labs:  Recent Labs Lab 08/03/16 0422  LATICACIDVEN 1.9    No results found for this or any previous visit (from the past 240 hour(s)).       Radiology Studies: US Abdomen  Complete  Result Date: 08/04/2016 CLINICAL DATA:  Elevated liver function tests. EXAM: ABDOMEN ULTRASOUND COMPLETE COMPARISON:  CT abdomen and pelvis 08/03/2016. FINDINGS: Gallbladder: No gallstones or wall thickening visualized. Small amount of gallbladder sludge is identified. No sonographic Murphy sign noted by sonographer. Common bile duct: Diameter: Proximal common bile duct measures 0.4 cm. Distally, the duct measures 0.9 cm. Liver: No focal lesion identified. Within normal limits in parenchymal echogenicity. IVC:  No abnormality visualized. Pancreas: Visualized portion unremarkable. Spleen: Size and appearance within normal limits. Right Kidney: Length: 10.2 cm. Echogenicity within normal limits. 0.9 cm cyst upper pole noted. No hydronephrosis visualized. Left Kidney: Length: 10.3 cm. Echogenicity within normal limits. No mass or hydronephrosis visualized. Abdominal aorta: No aneurysm visualized. Other findings: Right pleural effusion is identified. IMPRESSION: Small amount of gallbladder sludge. Negative for stones or cholecystitis. Negative for intrahepatic biliary ductal dilatation. The distal common bile duct measures 0.9 cm but no stone or other evidence of obstruction is seen. Electronically Signed   By: Drusilla Kanner M.D.   On: 08/04/2016 12:26   Dg Chest Port 1 View  Result Date: 08/04/2016 CLINICAL DATA:  Respiratory crackles in both lungs. Increasing shortness of breath tonight. EXAM: PORTABLE CHEST 1 VIEW COMPARISON:  Radiograph yesterday. FINDINGS: Single lead left-sided pacemaker in place. Cardiomegaly is unchanged. Increasing hazy opacity at the right lung base may be pleural effusion or airspace disease. Increasing pulmonary edema in Kerley B-lines. No pneumothorax. Stable osseous structures. IMPRESSION: Increasing pulmonary edema. Progressive hazy opacity at the right lung base may be increased pleural fluid or airspace disease, atelectasis or pneumonia. Electronically Signed   By:  Rubye Oaks M.D.   On: 08/04/2016 01:36   Dg Chest Port 1 View  Result Date: 08/03/2016 CLINICAL DATA:  Shortness of breath, history of CHF, cardiomyopathy, coronary artery disease, chronic renal insufficiency. EXAM: PORTABLE CHEST 1 VIEW COMPARISON:  None in PACs FINDINGS: The lungs are well-expanded. The interstitial markings are mildly prominent especially at the right lung base. The cardiac silhouette is enlarged. The pulmonary vascularity mildly engorged. The ICD is in stable position. There is calcification in the wall of the aortic arch. The bony thorax exhibits no acute abnormality. IMPRESSION: CHF with mild pulmonary interstitial edema. Minimal right infrahilar subsegmental atelectasis is suspected. Electronically Signed   By: David  [place] M.D.   On: 08/03/2016 07:42   Ct Renal Stone Study  Result Date: 08/03/2016 CLINICAL DATA:  70 y/o M; right flank pain. History of chronic kidney disease, appendectomy, and partial colectomy. EXAM: CT ABDOMEN AND PELVIS WITHOUT CONTRAST TECHNIQUE: Multidetector CT imaging of the abdomen and pelvis was performed following the standard protocol without IV contrast. COMPARISON:  None. FINDINGS: Lower chest: AICD lead in the right ventricle. Severe coronary artery calcification. Normal heart size. No pericardial effusion. Small right greater than left pleural effusions. Hepatobiliary: No focal liver abnormality is seen. No gallstones, gallbladder wall thickening, or biliary dilatation. Pancreas: There is indistinctness of fat surrounding the pancreatic and retroperitoneal edema greater on the right. Spleen: Normal in size without focal abnormality. Adrenals/Urinary Tract: Adrenal glands are unremarkable. Kidneys are normal, without renal calculi, focal lesion, or hydronephrosis. Bladder is unremarkable. Stomach/Bowel: Stomach is within normal limits. Appendix appears normal. No evidence of bowel wall thickening, distention, or inflammatory changes. Few sigmoid  diverticular present. Colorectal anastomosis is patent without obstruction. Vascular/Lymphatic: Aortic atherosclerosis. No enlarged abdominal or pelvic lymph nodes. Reproductive: Prostate is unremarkable. Other: No abdominal wall hernia or abnormality. No abdominopelvic ascites. Musculoskeletal: Moderate spondylosis of the visible thoracolumbar spine. No acute osseous abnormality is evident. IMPRESSION: 1. Indistinctness of pancreatic head and mild nonspecific retroperitoneal edema may represent acute pancreatitis. Correlation with lipase recommended. 2. No hydronephrosis or urinary stone disease. 3. Mild sigmoid diverticulosis. No obstructive or inflammatory changes of bowel identified. 4. Small bilateral pleural effusions. Electronically Signed   By: Mitzi Hansen M.D.   On: 08/03/2016 03:02  Scheduled Meds: . atorvastatin  80 mg Oral q1800  . clopidogrel  75 mg Oral Daily  . coumadin book   Does not apply Once  . diltiazem  120 mg Oral Daily  . DULoxetine  90 mg Oral Daily  . folic acid  1 mg Oral Daily  . metoprolol tartrate  50 mg Oral BID  . multivitamin with minerals  1 tablet Oral Daily  . thiamine  100 mg Oral Daily   Or  . thiamine  100 mg Intravenous Daily  . warfarin  2 mg Oral ONCE-1800  . warfarin   Does not apply Once  . Warfarin - Pharmacist Dosing Inpatient   Does not apply q1800   Continuous Infusions: . sodium chloride Stopped (08/03/16 1500)  . sodium chloride Stopped (08/04/16 0332)  . furosemide Stopped (08/04/16 1101)  . heparin 1,200 Units/hr (08/03/16 2118)     LOS: 2 days    Time spent: 35 minutes.     Kathlen ModyAKULA,Oney Tatlock, MD [facility] Pager [phone]   If 7PM-7AM, please contact night-coverage [email or web address] Password [email or web address] 08/04/2016, 1:26 PM

## 2016-08-04 NOTE — Progress Notes (Signed)
Pt off bipap.  Vitals stable

## 2016-08-04 NOTE — Plan of Care (Signed)
Problem: Safety: Goal: Ability to remain free from injury will improve Outcome: Progressing Safety bundle in place. No injuries this shift. Will continue to monitor.

## 2016-08-04 NOTE — Progress Notes (Signed)
ANTICOAGULATION CONSULT NOTE - Pharmacy Consult for Heparin and Coumadn Indication: atrial fibrillation  Allergies  Allergen Reactions  . Penicillins Other (See Comments)    Unknown    Patient Measurements: Weight: 200 lb 12.8 oz (91.1 kg)Weight = 91 kg   Vital Signs: Temp: 97.8 F (36.6 C) (05/09 0755) Temp Source: Oral (05/09 0755) BP: 131/89 (05/09 0852) Pulse Rate: 75 (05/09 0755)  Labs:  Recent Labs  08/02/16 2157 08/03/16 0422 08/03/16 [id number] 08/03/16 0727 08/03/16 0932 08/03/16 1352 08/04/16 0231 08/04/16 0323  HGB 12.9* 12.4*  --   --   --   --  12.3*  --   HCT 38.8* 37.8*  --   --   --   --  37.7*  --   PLT 108* 101*  --  95*  --   --  118*  --   APTT  --   --   --  113*  --   --   --   --   LABPROT  --   --   --  19.1*  --   --  21.0*  --   INR  --   --   --  1.59  --   --  1.78  --   HEPARINUNFRC  --   --  0.33  --   --  0.51 0.45  --   CREATININE 2.79* 3.35*  --   --   --   --  3.75*  3.86*  --   TROPONINI 0.23* 0.23*  --   --  0.17*  --   --  0.19*    CrCl cannot be calculated (Unknown ideal weight.).   Medical History: Past Medical History:  Diagnosis Date  . Cardiomyopathy (HCC)    EF 40% s/p dual chamber AICD  . CHF (congestive heart failure) (HCC)   . CKD (chronic kidney disease)    told by his PCP about 8 months ago and was seen by a nephrologist and his ACE I was stopped.    . Coronary artery disease 2012   s/p MI 2012 with PCI and 2 stents  . Diverticulitis   . Peripheral neuropathy      Assessment: 71 y.o male [facility] with new onset Afib RVR and AKI. Pharmacy was consulted on 08/02/16 to manage heparin dosing. Baseline hgb 13.1, pltc 106K,  No oral anticoagulation PTA  Heparin level is 0.45, remains therapeutic on heparin drip 1200 units/hr.  INR = 1.78. , bleeding reported. Hgb low stable at 12's, pltc low/stable 108>95>118k  LFTs elevated. Albumin 3.7 AKI: Scr increasing 3.75   Goal of Therapy:   INR 2-3 Heparin level 0.3-0.7 units/ml Monitor platelets by anticoagulation protocol: Yes   Plan:  Continue Heparin infusion 1200 units/hr  Coumadin 2 mg po x1 Daily HL, INR and CBC. Educate patient about coumadin. Book/video ordered.  Thank you for allowing pharmacy to be part of this patients care team. Noah Delaineuth Jarrad Mclees, RPh Clinical Pharmacist Pager: [phone] Main Pharmacy [phone] 08/04/2016,10:30 AM

## 2016-08-04 NOTE — Consult Note (Signed)
Referring Provider:   V. Blake Divine, MD ([facility]) Primary Care Physician:  [facility] Primary Gastroenterologist:  None (unassigned)  Reason for Consultation:  Elevated LFT's  HPI: Jerry Baker is a 71 y.o. male transferred 48 hrs ago from [facility] w/ afib/RVR w/ SOB (h/o CAD, CHF w/ EF 40%, h/o pacemaker/ICD).  Since adm, transaminases have risen from the 50-100 range to [phone] range (T Bili 1.8 -> 2.0 during same interval).  Pt has moderate thrombocytopenia (plt c. 100K) but CT neg for nodular liver, ascites, splenomegaly, or varices.  Pt is not aware of any prior h/o liver dis.  Drinks moderately (2 beers/day), no h/o severe obesity/dyslipidemia (to his knowledge)/DM.   Has never previously been told of having elev LFT's.  No hepatotoxic meds PTA.  HR currently controlled.   Past Medical History:  Diagnosis Date  . Cardiomyopathy (HCC)    EF 40% s/p dual chamber AICD  . CHF (congestive heart failure) (HCC)   . CKD (chronic kidney disease)    told by his PCP about 8 months ago and was seen by a nephrologist and his ACE I was stopped.    . Coronary artery disease 2012   s/p MI 2012 with PCI and 2 stents  . Diverticulitis   . Peripheral neuropathy     Past Surgical History:  Procedure Laterality Date  . APPENDECTOMY    . CARDIAC CATHETERIZATION    . CORONARY ANGIOPLASTY    . ELBOW SURGERY Left   . FINGER GANGLION CYST EXCISION    . INSERT / REPLACE / REMOVE PACEMAKER  2014   dual chamber AICD  . PARTIAL COLECTOMY    . ROTATOR CUFF REPAIR Left   . urinary bladder surgery      Prior to Admission medications   Medication Sig Start Date End Date Taking? Authorizing Provider  ASPERCREME LIDOCAINE EX Apply 1 application topically as needed (for pain).   Yes [provider]  aspirin EC 81 MG tablet Take 162 mg by mouth daily.    Yes [provider]  clopidogrel (PLAVIX) 75 MG tablet Take 75 mg by mouth daily.   Yes [provider]  Cyanocobalamin (VITAMIN B 12 PO) Take 100 mcg by mouth daily.   Yes [provider]  DULoxetine (CYMBALTA) 30 MG capsule Take 90 mg by mouth daily.    Yes [provider]    Current Facility-Administered Medications  Medication Dose Route Frequency Provider Last Rate Last Dose  . 0.9 %  sodium chloride infusion   Intravenous Continuous Kirby-Graham, Beather Arbour, NP   Stopped at 08/03/16 1500  . 0.9 %  sodium chloride infusion   Intravenous Continuous Wendall Stade, MD   Stopped at 08/04/16 [phone]  . acetaminophen (TYLENOL) tablet 650 mg  650 mg Oral Q6H PRN Eduard Clos, MD       Or  . acetaminophen (TYLENOL) suppository 650 mg  650 mg Rectal Q6H PRN Eduard Clos, MD      . atorvastatin (LIPITOR) tablet 80 mg  80 mg Oral q1800 Quintella Reichert, MD   80 mg at 08/03/16 1759  . clopidogrel (PLAVIX) tablet 75 mg  75 mg Oral Daily Quintella Reichert, MD   75 mg at 08/04/16 0852  . coumadin book   Does not apply Once Kathlen Mody, MD      . diltiazem (CARDIZEM CD) 24 hr capsule 120 mg  120 mg Oral Daily Berton Bon, NP   120  mg at 08/04/16 1237  . DULoxetine (CYMBALTA) DR capsule 90 mg  90 mg Oral Daily Eduard Clos, MD   90 mg at 08/04/16 1610  . folic acid (FOLVITE) tablet 1 mg  1 mg Oral Daily Eduard Clos, MD   1 mg at 08/04/16 9604  . furosemide (LASIX) 120 mg in dextrose 5 % 50 mL IVPB  120 mg Intravenous Q12H Primitivo Gauze, MD   Stopped at 08/04/16 1101  . heparin ADULT infusion 100 units/mL (25000 units/246mL sodium chloride 0.45%)  1,200 Units/hr Intravenous Continuous Robinette Haines, RPH 12 mL/hr at 08/03/16 2118 1,200 Units/hr at 08/03/16 2118  . LORazepam (ATIVAN) tablet 1 mg  1 mg Oral Q6H PRN Eduard Clos, MD   1 mg at 08/04/16 5409   Or  . LORazepam (ATIVAN) injection 1 mg  1 mg Intravenous Q6H PRN Eduard Clos, MD      . metoprolol (LOPRESSOR) tablet 50 mg  50 mg Oral BID Wendall Stade, MD   50 mg at  08/04/16 0853  . multivitamin with minerals tablet 1 tablet  1 tablet Oral Daily Eduard Clos, MD   1 tablet at 08/04/16 [phone]  . promethazine (PHENERGAN) injection 12.5 mg  12.5 mg Intravenous Q6H PRN Eduard Clos, MD   12.5 mg at 08/03/16 0730  . thiamine (VITAMIN B-1) tablet 100 mg  100 mg Oral Daily Eduard Clos, MD   100 mg at 08/04/16 1478   Or  . thiamine (B-1) injection 100 mg  100 mg Intravenous Daily Eduard Clos, MD      . warfarin (COUMADIN) tablet 2 mg  2 mg Oral ONCE-1800 Kathlen Mody, MD      . Warfarin - Pharmacist Dosing Inpatient   Does not apply [id number] Berton Bon, NP        Allergies as of 08/02/2016 - Review Complete 08/02/2016  Allergen Reaction Noted  . Penicillins  08/02/2016    Family History  Problem Relation Age of Onset  . Dementia Mother   . Breast cancer Mother   . Cancer Father   . Parkinson's disease Sister     Social History   Social History  . Marital status: Married    Spouse name: N/A  . Number of children: N/A  . Years of education: N/A   Occupational History  . Not on file.   Social History Main Topics  . Smoking status: Former Smoker    Quit date: 08/03/2010  . Smokeless tobacco: Never Used  . Alcohol use 4.2 oz/week    7 Cans of beer per week  . Drug use: No  . Sexual activity: Not on file   Other Topics Concern  . Not on file   Social History Narrative  . No narrative on file    Review of Systems: breathing currently much more comfortable  Physical Exam:  NEGATIVE FOR STIGMATA OF CHRONIC LIVER DISEASE Vital signs in last 24 hours: Temp:  [97.6 F (36.4 C)-98.2 F (36.8 C)] 97.7 F (36.5 C) (05/09 1109) Pulse Rate:  [68-89] 68 (05/09 1109) Resp:  [18-30] 21 (05/09 1109) BP: (110-131)/(72-97) 115/84 (05/09 1109) SpO2:  [93 %-100 %] 100 % (05/09 1109) Last BM Date: 08/03/16 General:   Alert,  Well-developed, well-nourished, pleasant and cooperative in NAD Head:  Normocephalic and  atraumatic. Eyes:  Sclera clear, no icterus.   Conjunctiva pink. Neck:   No masses or thyromegaly. Lungs:  High-pitched whistling soft late-inspiratory wheeze bilaterally.  No rales. Heart:   Regular rate and rhythm; no murmurs, clicks, rubs,  or gallops. Abdomen: Mildly adipose; no HSM, no evident ascites, no G/M/T Extremities:   Without clubbing, cyanosis, or edema. Neurologic:  Alert and coherent;  grossly normal neurologically. Skin:  Intact without significant lesions or rashes. No spiders. Cervical Nodes:  No significant cervical adenopathy. Psych:   Alert and cooperative. Normal mood and affect.  Intake/Output from previous day: 05/08 0701 - 05/09 0700 In: 729.6 [I.V.:729.6] Out: 550 [Urine:550] Intake/Output this shift: No intake/output data recorded.  Lab Results:  Recent Labs  08/02/16 2157 08/03/16 0422 08/03/16 0727 08/04/16 0231  WBC 10.4 8.4  --  11.8*  HGB 12.9* 12.4*  --  12.3*  HCT 38.8* 37.8*  --  37.7*  PLT 108* 101* 95* 118*   BMET  Recent Labs  08/02/16 2157 08/03/16 0422 08/04/16 0231  NA 137 137 136  136  K 5.6* 5.5* 4.8  4.7  CL 105 106 103  103  CO2 21* 20* 21*  20*  GLUCOSE 106* 153* 118*  119*  BUN 55* 61* 70*  70*  CREATININE 2.79* 3.35* 3.75*  3.86*  CALCIUM 9.2 8.7* 8.3*  8.4*   LFT  Recent Labs  08/04/16 0600  PROT 6.3*  ALBUMIN 3.7  AST 709*  ALT 938*  ALKPHOS 89  BILITOT 2.0*  BILIDIR 0.5  IBILI 1.5*   PT/INR  Recent Labs  08/03/16 0727 08/04/16 0231  LABPROT 19.1* 21.0*  INR 1.59 1.78    Studies/Results: US Abdomen Complete  Result Date: 08/04/2016 CLINICAL DATA:  Elevated liver function tests. EXAM: ABDOMEN ULTRASOUND COMPLETE COMPARISON:  CT abdomen and pelvis 08/03/2016. FINDINGS: Gallbladder: No gallstones or wall thickening visualized. Small amount of gallbladder sludge is identified. No sonographic Murphy sign noted by sonographer. Common bile duct: Diameter: Proximal common bile duct measures  0.4 cm. Distally, the duct measures 0.9 cm. Liver: No focal lesion identified. Within normal limits in parenchymal echogenicity. IVC: No abnormality visualized. Pancreas: Visualized portion unremarkable. Spleen: Size and appearance within normal limits. Right Kidney: Length: 10.2 cm. Echogenicity within normal limits. 0.9 cm cyst upper pole noted. No hydronephrosis visualized. Left Kidney: Length: 10.3 cm. Echogenicity within normal limits. No mass or hydronephrosis visualized. Abdominal aorta: No aneurysm visualized. Other findings: Right pleural effusion is identified. IMPRESSION: Small amount of gallbladder sludge. Negative for stones or cholecystitis. Negative for intrahepatic biliary ductal dilatation. The distal common bile duct measures 0.9 cm but no stone or other evidence of obstruction is seen. Electronically Signed   By: Drusilla Kanner M.D.   On: 08/04/2016 12:26   Dg Chest Port 1 View  Result Date: 08/04/2016 CLINICAL DATA:  Respiratory crackles in both lungs. Increasing shortness of breath tonight. EXAM: PORTABLE CHEST 1 VIEW COMPARISON:  Radiograph yesterday. FINDINGS: Single lead left-sided pacemaker in place. Cardiomegaly is unchanged. Increasing hazy opacity at the right lung base may be pleural effusion or airspace disease. Increasing pulmonary edema in Kerley B-lines. No pneumothorax. Stable osseous structures. IMPRESSION: Increasing pulmonary edema. Progressive hazy opacity at the right lung base may be increased pleural fluid or airspace disease, atelectasis or pneumonia. Electronically Signed   By: Rubye Oaks M.D.   On: 08/04/2016 01:36   Dg Chest Port 1 View  Result Date: 08/03/2016 CLINICAL DATA:  Shortness of breath, history of CHF, cardiomyopathy, coronary artery disease, chronic renal insufficiency. EXAM: PORTABLE CHEST 1 VIEW COMPARISON:  None in PACs FINDINGS: The lungs are well-expanded. The interstitial markings are mildly  prominent especially at the right lung base. The  cardiac silhouette is enlarged. The pulmonary vascularity mildly engorged. The ICD is in stable position. There is calcification in the wall of the aortic arch. The bony thorax exhibits no acute abnormality. IMPRESSION: CHF with mild pulmonary interstitial edema. Minimal right infrahilar subsegmental atelectasis is suspected. Electronically Signed   By: David  Swazil[place]Jordan M.D.   On: 08/03/2016 07:42   Ct Renal Stone Study  Result Date: 08/03/2016 CLINICAL DATA:  71 y/o M; right flank pain. History of chronic kidney disease, appendectomy, and partial colectomy. EXAM: CT ABDOMEN AND PELVIS WITHOUT CONTRAST TECHNIQUE: Multidetector CT imaging of the abdomen and pelvis was performed following the standard protocol without IV contrast. COMPARISON:  None. FINDINGS: Lower chest: AICD lead in the right ventricle. Severe coronary artery calcification. Normal heart size. No pericardial effusion. Small right greater than left pleural effusions. Hepatobiliary: No focal liver abnormality is seen. No gallstones, gallbladder wall thickening, or biliary dilatation. Pancreas: There is indistinctness of fat surrounding the pancreatic and retroperitoneal edema greater on the right. Spleen: Normal in size without focal abnormality. Adrenals/Urinary Tract: Adrenal glands are unremarkable. Kidneys are normal, without renal calculi, focal lesion, or hydronephrosis. Bladder is unremarkable. Stomach/Bowel: Stomach is within normal limits. Appendix appears normal. No evidence of bowel wall thickening, distention, or inflammatory changes. Few sigmoid diverticular present. Colorectal anastomosis is patent without obstruction. Vascular/Lymphatic: Aortic atherosclerosis. No enlarged abdominal or pelvic lymph nodes. Reproductive: Prostate is unremarkable. Other: No abdominal wall hernia or abnormality. No abdominopelvic ascites. Musculoskeletal: Moderate spondylosis of the visible thoracolumbar spine. No acute osseous abnormality is evident.  IMPRESSION: 1. Indistinctness of pancreatic head and mild nonspecific retroperitoneal edema may represent acute pancreatitis. Correlation with lipase recommended. 2. No hydronephrosis or urinary stone disease. 3. Mild sigmoid diverticulosis. No obstructive or inflammatory changes of bowel identified. 4. Small bilateral pleural effusions. Electronically Signed   By: Mitzi HansenLance  Furusawa-Stratton M.D.   On: 08/03/2016 03:02    Impression: Probable "shock liver"--no evid of underlying or chronic liver disease (assuming low plts due to some other cause)  Plan: Observation. Daily LFT's.  If this is "shock liver," would expect trend toward normalization in 1-2 days.   LOS: 2 days   Jahaziel Francois V  08/04/2016, 3:04 PM   Pager [phone] If no answer or after 5 PM call [phone]

## 2016-08-04 NOTE — Progress Notes (Signed)
Progress Note  Patient Name: Jerry Baker Date of Encounter: 08/04/2016  Primary Cardiologist: Armanda Magic Wants to continue to f/u with [facility] on d/c  Subjective   Less dyspnea no chest pain   Inpatient Medications    Scheduled Meds: . atorvastatin  80 mg Oral q1800  . clopidogrel  75 mg Oral Daily  . diltiazem  30 mg Oral Q6H  . DULoxetine  90 mg Oral Daily  . folic acid  1 mg Oral Daily  . metoprolol tartrate  50 mg Oral BID  . multivitamin with minerals  1 tablet Oral Daily  . thiamine  100 mg Oral Daily   Or  . thiamine  100 mg Intravenous Daily  . Warfarin - Pharmacist Dosing Inpatient   Does not apply q1800   Continuous Infusions: . sodium chloride Stopped (08/03/16 1500)  . sodium chloride Stopped (08/04/16 0332)  . furosemide    . heparin 1,200 Units/hr (08/03/16 2118)   PRN Meds: acetaminophen **OR** acetaminophen, LORazepam **OR** LORazepam, promethazine   Vital Signs    Vitals:   08/04/16 0345 08/04/16 0515 08/04/16 0755 08/04/16 0852  BP: 115/76 114/77 111/76 131/89  Pulse: 78 74 75   Resp: (!) 21 19 18    Temp:   97.8 F (36.6 C)   TempSrc:   Oral   SpO2: 99% 97% 94%   Weight:        Intake/Output Summary (Last 24 hours) at 08/04/16 1610 Last data filed at 08/04/16 0700  Gross per 24 hour  Intake           616.04 ml  Output              550 ml  Net            66.04 ml   Filed Weights   08/03/16 9604  Weight: 200 lb 12.8 oz (91.1 kg)    Telemetry    Afib rates 12  - Personally Reviewed  ECG    Afib IVCD LBBB type  - Personally Reviewed  Physical Exam  Affect appropriate Chronically ill white male  HEENT: normal Neck supple with no adenopathy JVP normal no bruits no thyromegaly Lungs clear with no wheezing and good diaphragmatic motion Heart:  S1/S2 no murmur, no rub, gallop or click PMI normal Abdomen: benighn, BS positve, no tenderness, no AAA no bruit.  No HSM or HJR Distal pulses intact with no bruits No  edema Neuro non-focal Skin warm and dry No muscular weakness   Labs    Chemistry  Recent Labs Lab 08/02/16 2157 08/03/16 0422 08/04/16 0231 08/04/16 0600  NA 137 137 136  136  --   K 5.6* 5.5* 4.8  4.7  --   CL 105 106 103  103  --   CO2 21* 20* 21*  20*  --   GLUCOSE 106* 153* 118*  119*  --   BUN 55* 61* 70*  70*  --   CREATININE 2.79* 3.35* 3.75*  3.86*  --   CALCIUM 9.2 8.7* 8.3*  8.4*  --   PROT 6.8  --   --  6.3*  ALBUMIN 4.0  --  3.7 3.7  AST 59*  --   --  709*  ALT 88*  --   --  938*  ALKPHOS 79  --   --  89  BILITOT 1.8*  --   --  2.0*  GFRNONAA 21* 17* 15*  14*  --   GFRAA 25* 20*  17*  17*  --   ANIONGAP 11 11 12  13   --      Hematology  Recent Labs Lab 08/02/16 2157 08/03/16 0422 08/03/16 0539 08/03/16 0727 08/04/16 0231  WBC 10.4 8.4  --   --  11.8*  RBC 4.05* 3.97* 4.07*  --  3.90*  HGB 12.9* 12.4*  --   --  12.3*  HCT 38.8* 37.8*  --   --  37.7*  MCV 95.8 95.2  --   --  96.7  MCH 31.9 31.2  --   --  31.5  MCHC 33.2 32.8  --   --  32.6  RDW 13.4 13.5  --   --  13.7  PLT 108* 101*  --  95* 118*    Cardiac Enzymes  Recent Labs Lab 08/02/16 2157 08/03/16 0422 08/03/16 0932 08/04/16 0323  TROPONINI 0.23* 0.23* 0.17* 0.19*   No results for input(s): TROPIPOC in the last 168 hours.   BNP  Recent Labs Lab 08/03/16 0422  BNP 763.3*     DDimer   Recent Labs Lab 08/03/16 0727  DDIMER 3.62*     Radiology    Dg Chest Port 1 View  Result Date: 08/04/2016 CLINICAL DATA:  Respiratory crackles in both lungs. Increasing shortness of breath tonight. EXAM: PORTABLE CHEST 1 VIEW COMPARISON:  Radiograph yesterday. FINDINGS: Single lead left-sided pacemaker in place. Cardiomegaly is unchanged. Increasing hazy opacity at the right lung base may be pleural effusion or airspace disease. Increasing pulmonary edema in Kerley B-lines. No pneumothorax. Stable osseous structures. IMPRESSION: Increasing pulmonary edema. Progressive hazy  opacity at the right lung base may be increased pleural fluid or airspace disease, atelectasis or pneumonia. Electronically Signed   By: Rubye OaksMelanie  Ehinger M.D.   On: 08/04/2016 01:36   Dg Chest Port 1 View  Result Date: 08/03/2016 CLINICAL DATA:  Shortness of breath, history of CHF, cardiomyopathy, coronary artery disease, chronic renal insufficiency. EXAM: PORTABLE CHEST 1 VIEW COMPARISON:  None in PACs FINDINGS: The lungs are well-expanded. The interstitial markings are mildly prominent especially at the right lung base. The cardiac silhouette is enlarged. The pulmonary vascularity mildly engorged. The ICD is in stable position. There is calcification in the wall of the aortic arch. The bony thorax exhibits no acute abnormality. IMPRESSION: CHF with mild pulmonary interstitial edema. Minimal right infrahilar subsegmental atelectasis is suspected. Electronically Signed   By: David  [place]azil[place]Jordan M.D.   On: 08/03/2016 07:42   Ct Renal Stone Study  Result Date: 08/03/2016 CLINICAL DATA:  71 y/o M; right flank pain. History of chronic kidney disease, appendectomy, and partial colectomy. EXAM: CT ABDOMEN AND PELVIS WITHOUT CONTRAST TECHNIQUE: Multidetector CT imaging of the abdomen and pelvis was performed following the standard protocol without IV contrast. COMPARISON:  None. FINDINGS: Lower chest: AICD lead in the right ventricle. Severe coronary artery calcification. Normal heart size. No pericardial effusion. Small right greater than left pleural effusions. Hepatobiliary: No focal liver abnormality is seen. No gallstones, gallbladder wall thickening, or biliary dilatation. Pancreas: There is indistinctness of fat surrounding the pancreatic and retroperitoneal edema greater on the right. Spleen: Normal in size without focal abnormality. Adrenals/Urinary Tract: Adrenal glands are unremarkable. Kidneys are normal, without renal calculi, focal lesion, or hydronephrosis. Bladder is unremarkable. Stomach/Bowel: Stomach is  within normal limits. Appendix appears normal. No evidence of bowel wall thickening, distention, or inflammatory changes. Few sigmoid diverticular present. Colorectal anastomosis is patent without obstruction. Vascular/Lymphatic: Aortic atherosclerosis. No enlarged abdominal or pelvic lymph nodes. Reproductive:  Prostate is unremarkable. Other: No abdominal wall hernia or abnormality. No abdominopelvic ascites. Musculoskeletal: Moderate spondylosis of the visible thoracolumbar spine. No acute osseous abnormality is evident. IMPRESSION: 1. Indistinctness of pancreatic head and mild nonspecific retroperitoneal edema may represent acute pancreatitis. Correlation with lipase recommended. 2. No hydronephrosis or urinary stone disease. 3. Mild sigmoid diverticulosis. No obstructive or inflammatory changes of bowel identified. 4. Small bilateral pleural effusions. Electronically Signed   By: Mitzi Hansen M.D.   On: 08/03/2016 03:02    Cardiac Studies   Echo pending   Patient Profile     71 y.o. 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 Previous stent placement Problems started with contrast for right shoulder. Transferred with dyspnea acute renal Failure troponin mildly elevated history of ETOH abuse with DT risk  Assessment & Plan    Afib:  Converted continue coumadin and heparin and beta blocker  CAD: no chest pain no need for cath troponin elevated in setting CHF and elevated CR continue plavix  A/CRF:  Per primary service may be related to contrast and cardiorenal echo pending to see how bad EF is .   ETOH:  DT prophylaxis   LFT;s:  He has been drinking beer heavily for last 5-6 months hepatitis panel ordered as well as [place] of abdomen. GI consult doubt this is passive liver congestion from CHF will d/c statin for now   Signed, Charlton Haws, MD  08/04/2016, 9:24 AM

## 2016-08-04 NOTE — Progress Notes (Signed)
Jerry Baker   DOB:1945-12-25   WG#:[id number]    Subjective: He is feeling better.  His breathing has improved with less shortness of breath. The patient denies any recent signs or symptoms of bleeding such as spontaneous epistaxis, hematuria or hematochezia.   Objective:  Vitals:   08/04/16 0852 08/04/16 1109  BP: 131/89 115/84  Pulse:  68  Resp:  (!) 21  Temp:  97.7 F (36.5 C)     Intake/Output Summary (Last 24 hours) at 08/04/16 1341 Last data filed at 08/04/16 0700  Gross per 24 hour  Intake           207.16 ml  Output              550 ml  Net          -342.84 ml    GENERAL:alert, no distress and comfortable SKIN: skin color, texture, turgor are normal, no rashes or significant lesions EYES: normal, Conjunctiva are pink and non-injected, sclera clear Musculoskeletal:no cyanosis of digits and no clubbing  NEURO: alert & oriented x 3 with fluent speech, no focal motor/sensory deficits   Labs:  Lab Results  Component Value Date   WBC 11.8 (H) 08/04/2016   HGB 12.3 (L) 08/04/2016   HCT 37.7 (L) 08/04/2016   MCV 96.7 08/04/2016   PLT 118 (L) 08/04/2016   NEUTROABS 7.4 08/02/2016    Lab Results  Component Value Date   NA 136 08/04/2016   NA 136 08/04/2016   K 4.8 08/04/2016   K 4.7 08/04/2016   CL 103 08/04/2016   CL 103 08/04/2016   CO2 21 (L) 08/04/2016   CO2 20 (L) 08/04/2016    Studies:  [place] Abdomen Complete  Result Date: 08/04/2016 CLINICAL DATA:  Elevated liver function tests. EXAM: ABDOMEN ULTRASOUND COMPLETE COMPARISON:  CT abdomen and pelvis 08/03/2016. FINDINGS: Gallbladder: No gallstones or wall thickening visualized. Small amount of gallbladder sludge is identified. No sonographic Murphy sign noted by sonographer. Common bile duct: Diameter: Proximal common bile duct measures 0.4 cm. Distally, the duct measures 0.9 cm. Liver: No focal lesion identified. Within normal limits in parenchymal echogenicity. IVC: No abnormality visualized. Pancreas:  Visualized portion unremarkable. Spleen: Size and appearance within normal limits. Right Kidney: Length: 10.2 cm. Echogenicity within normal limits. 0.9 cm cyst upper pole noted. No hydronephrosis visualized. Left Kidney: Length: 10.3 cm. Echogenicity within normal limits. No mass or hydronephrosis visualized. Abdominal aorta: No aneurysm visualized. Other findings: Right pleural effusion is identified. IMPRESSION: Small amount of gallbladder sludge. Negative for stones or cholecystitis. Negative for intrahepatic biliary ductal dilatation. The distal common bile duct measures 0.9 cm but no stone or other evidence of obstruction is seen. Electronically Signed   By: Drusilla Kannerhomas  Dalessio M.D.   On: 08/04/2016 12:26   Dg Chest Port 1 View  Result Date: 08/04/2016 CLINICAL DATA:  Respiratory crackles in both lungs. Increasing shortness of breath tonight. EXAM: PORTABLE CHEST 1 VIEW COMPARISON:  Radiograph yesterday. FINDINGS: Single lead left-sided pacemaker in place. Cardiomegaly is unchanged. Increasing hazy opacity at the right lung base may be pleural effusion or airspace disease. Increasing pulmonary edema in Kerley B-lines. No pneumothorax. Stable osseous structures. IMPRESSION: Increasing pulmonary edema. Progressive hazy opacity at the right lung base may be increased pleural fluid or airspace disease, atelectasis or pneumonia. Electronically Signed   By: Rubye OaksMelanie  Ehinger M.D.   On: 08/04/2016 01:36   Dg Chest Port 1 View  Result Date: 08/03/2016 CLINICAL DATA:  Shortness of breath, history of  CHF, cardiomyopathy, coronary artery disease, chronic renal insufficiency. EXAM: PORTABLE CHEST 1 VIEW COMPARISON:  None in PACs FINDINGS: The lungs are well-expanded. The interstitial markings are mildly prominent especially at the right lung base. The cardiac silhouette is enlarged. The pulmonary vascularity mildly engorged. The ICD is in stable position. There is calcification in the wall of the aortic arch. The bony  thorax exhibits no acute abnormality. IMPRESSION: CHF with mild pulmonary interstitial edema. Minimal right infrahilar subsegmental atelectasis is suspected. Electronically Signed   By: David  [place] M.D.   On: 08/03/2016 07:42   Ct Renal Stone Study  Result Date: 08/03/2016 CLINICAL DATA:  71 y/o M; right flank pain. History of chronic kidney disease, appendectomy, and partial colectomy. EXAM: CT ABDOMEN AND PELVIS WITHOUT CONTRAST TECHNIQUE: Multidetector CT imaging of the abdomen and pelvis was performed following the standard protocol without IV contrast. COMPARISON:  None. FINDINGS: Lower chest: AICD lead in the right ventricle. Severe coronary artery calcification. Normal heart size. No pericardial effusion. Small right greater than left pleural effusions. Hepatobiliary: No focal liver abnormality is seen. No gallstones, gallbladder wall thickening, or biliary dilatation. Pancreas: There is indistinctness of fat surrounding the pancreatic and retroperitoneal edema greater on the right. Spleen: Normal in size without focal abnormality. Adrenals/Urinary Tract: Adrenal glands are unremarkable. Kidneys are normal, without renal calculi, focal lesion, or hydronephrosis. Bladder is unremarkable. Stomach/Bowel: Stomach is within normal limits. Appendix appears normal. No evidence of bowel wall thickening, distention, or inflammatory changes. Few sigmoid diverticular present. Colorectal anastomosis is patent without obstruction. Vascular/Lymphatic: Aortic atherosclerosis. No enlarged abdominal or pelvic lymph nodes. Reproductive: Prostate is unremarkable. Other: No abdominal wall hernia or abnormality. No abdominopelvic ascites. Musculoskeletal: Moderate spondylosis of the visible thoracolumbar spine. No acute osseous abnormality is evident. IMPRESSION: 1. Indistinctness of pancreatic head and mild nonspecific retroperitoneal edema may represent acute pancreatitis. Correlation with lipase recommended. 2. No  hydronephrosis or urinary stone disease. 3. Mild sigmoid diverticulosis. No obstructive or inflammatory changes of bowel identified. 4. Small bilateral pleural effusions. Electronically Signed   By: Mitzi Hansen M.D.   On: 08/03/2016 03:02    Assessment & Plan:   Mild anemia Likely due to anemia chronic illness.  Observe  Acute thrombocytopenia Cause unknown, could be due to recent alcoholism, shock liver, pancreatitis or liver disease Peripheral blood smear did not reveal any evidence of schistocytes.  Platelet clumping is not seen. Imaging study revealed evidence of pancreatitis.   Ultrasound abdomen show no evidence of abnormal liver disease There is no contraindication for him to receive IV heparin or antiplatelet agents as indicated, as long as platelet count is greater than 50,000  Elevated liver enzymes Recent alcoholism Pancreatitis on CT imaging Monitor liver function carefully  Elevated LDH Likely related to myocardial injury, liver injury and renal failure I do not believe this represented hemolysis  Cardiomyopathy, heart failure We will defer to primary service. Echocardiogram is pending Continue aggressive medical management  Acute renal failure Could be due to contrast nephropathy Uric acid is elevated Nephrologist is following, defer to them for further management  Discharge planning Not ready for discharge From the hematology standpoint, I have nothing further to add I will sign off.  Please call if questions arise   Artis Delay, MD 08/04/2016  1:41 PM

## 2016-08-04 NOTE — Progress Notes (Signed)
S: SOB last night and placed on BiPap.  Fluids stopped. CXR showed pulm edema O:BP 114/77   Pulse 74   Temp 98.1 F (36.7 C)   Resp 19   Wt 91.1 kg (200 lb 12.8 oz)   SpO2 97%   Intake/Output Summary (Last 24 hours) at 08/04/16 0700 Last data filed at 08/04/16 0500  Gross per 24 hour  Intake           729.64 ml  Output                0 ml  Net           729.64 ml   Weight change:  WUJ:WJXBJGen:awake and alert  Bipap mask in place CVS: RRR Resp: Decreased BS bases, few crackles Abd:+ BS NTND Ext: No edema NEURO:CNI Ox3 no asterixis   . atorvastatin  80 mg Oral q1800  . clopidogrel  75 mg Oral Daily  . diltiazem  30 mg Oral Q6H  . DULoxetine  90 mg Oral Daily  . folic acid  1 mg Oral Daily  . metoprolol tartrate  50 mg Oral BID  . multivitamin with minerals  1 tablet Oral Daily  . thiamine  100 mg Oral Daily   Or  . thiamine  100 mg Intravenous Daily  . Warfarin - Pharmacist Dosing Inpatient   Does not apply q1800   Dg Chest Port 1 View  Result Date: 08/04/2016 CLINICAL DATA:  Respiratory crackles in both lungs. Increasing shortness of breath tonight. EXAM: PORTABLE CHEST 1 VIEW COMPARISON:  Radiograph yesterday. FINDINGS: Single lead left-sided pacemaker in place. Cardiomegaly is unchanged. Increasing hazy opacity at the right lung base may be pleural effusion or airspace disease. Increasing pulmonary edema in Kerley B-lines. No pneumothorax. Stable osseous structures. IMPRESSION: Increasing pulmonary edema. Progressive hazy opacity at the right lung base may be increased pleural fluid or airspace disease, atelectasis or pneumonia. Electronically Signed   By: Rubye OaksMelanie  Ehinger M.D.   On: 08/04/2016 01:36   Dg Chest Port 1 View  Result Date: 08/03/2016 CLINICAL DATA:  Shortness of breath, history of CHF, cardiomyopathy, coronary artery disease, chronic renal insufficiency. EXAM: PORTABLE CHEST 1 VIEW COMPARISON:  None in PACs FINDINGS: The lungs are well-expanded. The interstitial  markings are mildly prominent especially at the right lung base. The cardiac silhouette is enlarged. The pulmonary vascularity mildly engorged. The ICD is in stable position. There is calcification in the wall of the aortic arch. The bony thorax exhibits no acute abnormality. IMPRESSION: CHF with mild pulmonary interstitial edema. Minimal right infrahilar subsegmental atelectasis is suspected. Electronically Signed   By: David  [place] M.D.   On: 08/03/2016 07:42   Ct Renal Stone Study  Result Date: 08/03/2016 CLINICAL DATA:  71 y/o M; right flank pain. History of chronic kidney disease, appendectomy, and partial colectomy. EXAM: CT ABDOMEN AND PELVIS WITHOUT CONTRAST TECHNIQUE: Multidetector CT imaging of the abdomen and pelvis was performed following the standard protocol without IV contrast. COMPARISON:  None. FINDINGS: Lower chest: AICD lead in the right ventricle. Severe coronary artery calcification. Normal heart size. No pericardial effusion. Small right greater than left pleural effusions. Hepatobiliary: No focal liver abnormality is seen. No gallstones, gallbladder wall thickening, or biliary dilatation. Pancreas: There is indistinctness of fat surrounding the pancreatic and retroperitoneal edema greater on the right. Spleen: Normal in size without focal abnormality. Adrenals/Urinary Tract: Adrenal glands are unremarkable. Kidneys are normal, without renal calculi, focal lesion, or hydronephrosis. Bladder is unremarkable. Stomach/Bowel: Stomach  is within normal limits. Appendix appears normal. No evidence of bowel wall thickening, distention, or inflammatory changes. Few sigmoid diverticular present. Colorectal anastomosis is patent without obstruction. Vascular/Lymphatic: Aortic atherosclerosis. No enlarged abdominal or pelvic lymph nodes. Reproductive: Prostate is unremarkable. Other: No abdominal wall hernia or abnormality. No abdominopelvic ascites. Musculoskeletal: Moderate spondylosis of the visible  thoracolumbar spine. No acute osseous abnormality is evident. IMPRESSION: 1. Indistinctness of pancreatic head and mild nonspecific retroperitoneal edema may represent acute pancreatitis. Correlation with lipase recommended. 2. No hydronephrosis or urinary stone disease. 3. Mild sigmoid diverticulosis. No obstructive or inflammatory changes of bowel identified. 4. Small bilateral pleural effusions. Electronically Signed   By: Mitzi Hansen M.D.   On: 08/03/2016 03:02   BMET    Component Value Date/Time   NA 136 08/04/2016 0231   NA 136 08/04/2016 0231   K 4.8 08/04/2016 0231   K 4.7 08/04/2016 0231   CL 103 08/04/2016 0231   CL 103 08/04/2016 0231   CO2 21 (L) 08/04/2016 0231   CO2 20 (L) 08/04/2016 0231   GLUCOSE 118 (H) 08/04/2016 0231   GLUCOSE 119 (H) 08/04/2016 0231   BUN 70 (H) 08/04/2016 0231   BUN 70 (H) 08/04/2016 0231   CREATININE 3.75 (H) 08/04/2016 0231   CREATININE 3.86 (H) 08/04/2016 0231   CALCIUM 8.3 (L) 08/04/2016 0231   CALCIUM 8.4 (L) 08/04/2016 0231   GFRNONAA 15 (L) 08/04/2016 0231   GFRNONAA 14 (L) 08/04/2016 0231   GFRAA 17 (L) 08/04/2016 0231   GFRAA 17 (L) 08/04/2016 0231   CBC    Component Value Date/Time   WBC 11.8 (H) 08/04/2016 0231   RBC 3.90 (L) 08/04/2016 0231   HGB 12.3 (L) 08/04/2016 0231   HCT 37.7 (L) 08/04/2016 0231   PLT 118 (L) 08/04/2016 0231   MCV 96.7 08/04/2016 0231   MCH 31.5 08/04/2016 0231   MCHC 32.6 08/04/2016 0231   RDW 13.7 08/04/2016 0231   LYMPHSABS 2.2 08/02/2016 2157   MONOABS 0.7 08/02/2016 2157   EOSABS 0.0 08/02/2016 2157   BASOSABS 0.0 08/02/2016 2157     Assessment: 1. Acute vs acute on CKD  ? If received IV contrast with CT scan last week at the [place] (still awaiting records) vs prerenal progressing to ATN.  I/O cath revealed 500cc urine according to nurse 2. A fib, now NSR 3. Thrombocytopenia, sl improved 4. Hx CAD 5. Pulm edema Plan: 1. Place foley cath 2. IV lasix 3. Daily Scr 4. Still  awaiting records from [place] 5. Recheck liver fx tests   Flynt Breeze T

## 2016-08-04 NOTE — Progress Notes (Addendum)
RN paged Dr. Toniann FailKakrakandy because pt was SOB with pressure in the chest and tachypneic. Dr. Kirtland BouchardK ordered a CXR, 20mg  of Lasix IV, and ABG.  NP spoke to RN after above and went to bedside to see the pt. S: Endorses chest "tightness" but he "wouldn't call it pain". Was SOB, but is better now since O2 bumped. Has been anxious, has vomited, and having sweaty episodes tonight. He says he is feeling better at time of assessment.  O: T 98.1 BP 110/77, HR 80, RR 26-30. O2 sat normal on 3L. Well appearing WM in very mild respiratory distress. He is alert and oriented. Appropriate in conversation. Smiling and engaged. Resp: He does have mildly increased WOB using abdominal muscles at a rate of 26-30. Lungs with congestion and this is reflected on CXR. Card: RRR. EKG looks fine. PVCs occasionally on tele. Skin: pale. Dry at present. Warm.  A/P: 1. Acute respiratory distress-attributing this to CHF as pt has EF of 40%. Lasix 20mg  given without voiding and then repeated. Monitor for urine output. If no response to Lasix, he may require Bipap for the pulmonary edema that NP suspects. ABG looks fine. O2 bumped to 3L. Given use of abd muscles, will change to NRB for now. Asked RN to wean this as respiratory effort improves. Hopefully, we will get some output/relief with the Lasix. CXR with confirmed evidence of pulmonary edema.  2. AKI-? Contrast related. nephro involved. r/p BMP. 3. PVCs-BMP, Mg++ 4. CP-? true cardiac related pain. Think it is more associated with his respiratory issues. EKG OK. His troponins were elevated slightly but have trended downward. Cards involved for this, CHF, and Afib. Recheck trop x 1. He does have a hx of previous stent placement. Is on Heparin drip, Plavix, BB, and statin. 5. CHF with ischemic DCM-as above. Echo pending. Per cards. 6. Afib-off cardizem drip. In NSR now.  RN to call if breathing doesn't improve after placement of NRB. May need Bipap if he doesn't respond to the Lasix. He is not  in acute distress, so at this time, think it is fine to see if he has any urine output.  KJKG, NP [facility] Update: Later, no void after Lasix x 2. Pt still with somewhat labored breathing with increased rate. Placed on Bipap.  KJKG, NP [facility]

## 2016-08-04 NOTE — Progress Notes (Signed)
Progress Note  Patient Name: Jerry Baker Date of Encounter: 08/04/2016  Primary Cardiologist: Armanda Magic Wants to continue to f/u with [facility] on d/c  Subjective   Less dyspnea no chest pain. Feeling better since rhythm converted. Pt tells me that for the last year he has been drinking 1-2 beers per day and about once a week drinks 5 or more.   Inpatient Medications    Scheduled Meds: . atorvastatin  80 mg Oral q1800  . clopidogrel  75 mg Oral Daily  . diltiazem  30 mg Oral Q6H  . DULoxetine  90 mg Oral Daily  . folic acid  1 mg Oral Daily  . metoprolol tartrate  50 mg Oral BID  . multivitamin with minerals  1 tablet Oral Daily  . thiamine  100 mg Oral Daily   Or  . thiamine  100 mg Intravenous Daily  . Warfarin - Pharmacist Dosing Inpatient   Does not apply q1800   Continuous Infusions: . sodium chloride Stopped (08/03/16 1500)  . sodium chloride Stopped (08/04/16 0332)  . furosemide    . heparin 1,200 Units/hr (08/03/16 2118)   PRN Meds: acetaminophen **OR** acetaminophen, LORazepam **OR** LORazepam, promethazine   Vital Signs    Vitals:   08/04/16 0345 08/04/16 0515 08/04/16 0755 08/04/16 0852  BP: 115/76 114/77 111/76 131/89  Pulse: 78 74 75   Resp: (!) 21 19 18    Temp:   97.8 F (36.6 C)   TempSrc:   Oral   SpO2: 99% 97% 94%   Weight:        Intake/Output Summary (Last 24 hours) at 08/04/16 0924 Last data filed at 08/04/16 0700  Gross per 24 hour  Intake           616.04 ml  Output              550 ml  Net            66.04 ml   Filed Weights   08/03/16 6962  Weight: 200 lb 12.8 oz (91.1 kg)    Telemetry    Sinus rhtym in the 70's  - Personally Reviewed  ECG    Now new tracings  Physical Exam   White male no distress GEN: No acute distress.   Neck: No JVD Cardiac: RRR, no murmurs, rubs or gallops, AICD under left clavicle   Respiratory: Clear to auscultation bilaterally with faint expiratory wheeze. GI: Soft, nontender,  non-distended  MS: No edema; No deformity. Neuro:  Nonfocal  Psych: Normal affect   Labs    Chemistry  Recent Labs Lab 08/02/16 2157 08/03/16 0422 08/04/16 0231 08/04/16 0600  NA 137 137 136  136  --   K 5.6* 5.5* 4.8  4.7  --   CL 105 106 103  103  --   CO2 21* 20* 21*  20*  --   GLUCOSE 106* 153* 118*  119*  --   BUN 55* 61* 70*  70*  --   CREATININE 2.79* 3.35* 3.75*  3.86*  --   CALCIUM 9.2 8.7* 8.3*  8.4*  --   PROT 6.8  --   --  6.3*  ALBUMIN 4.0  --  3.7 3.7  AST 59*  --   --  709*  ALT 88*  --   --  938*  ALKPHOS 79  --   --  89  BILITOT 1.8*  --   --  2.0*  GFRNONAA 21* 17* 15*  14*  --  GFRAA 25* 20* 17*  17*  --   ANIONGAP 11 11 12  13   --      Hematology  Recent Labs Lab 08/02/16 2157 08/03/16 0422 08/03/16 0539 08/03/16 0727 08/04/16 0231  WBC 10.4 8.4  --   --  11.8*  RBC 4.05* 3.97* 4.07*  --  3.90*  HGB 12.9* 12.4*  --   --  12.3*  HCT 38.8* 37.8*  --   --  37.7*  MCV 95.8 95.2  --   --  96.7  MCH 31.9 31.2  --   --  31.5  MCHC 33.2 32.8  --   --  32.6  RDW 13.4 13.5  --   --  13.7  PLT 108* 101*  --  95* 118*    Cardiac Enzymes  Recent Labs Lab 08/02/16 2157 08/03/16 0422 08/03/16 0932 08/04/16 0323  TROPONINI 0.23* 0.23* 0.17* 0.19*   No results for input(s): TROPIPOC in the last 168 hours.   BNP  Recent Labs Lab 08/03/16 0422  BNP 763.3*     DDimer   Recent Labs Lab 08/03/16 0727  DDIMER 3.62*     Radiology    Dg Chest Port 1 View  Result Date: 08/04/2016 CLINICAL DATA:  Respiratory crackles in both lungs. Increasing shortness of breath tonight. EXAM: PORTABLE CHEST 1 VIEW COMPARISON:  Radiograph yesterday. FINDINGS: Single lead left-sided pacemaker in place. Cardiomegaly is unchanged. Increasing hazy opacity at the right lung base may be pleural effusion or airspace disease. Increasing pulmonary edema in Kerley B-lines. No pneumothorax. Stable osseous structures. IMPRESSION: Increasing pulmonary edema.  Progressive hazy opacity at the right lung base may be increased pleural fluid or airspace disease, atelectasis or pneumonia. Electronically Signed   By: Rubye OaksMelanie  Ehinger M.D.   On: 08/04/2016 01:36   Dg Chest Port 1 View  Result Date: 08/03/2016 CLINICAL DATA:  Shortness of breath, history of CHF, cardiomyopathy, coronary artery disease, chronic renal insufficiency. EXAM: PORTABLE CHEST 1 VIEW COMPARISON:  None in PACs FINDINGS: The lungs are well-expanded. The interstitial markings are mildly prominent especially at the right lung base. The cardiac silhouette is enlarged. The pulmonary vascularity mildly engorged. The ICD is in stable position. There is calcification in the wall of the aortic arch. The bony thorax exhibits no acute abnormality. IMPRESSION: CHF with mild pulmonary interstitial edema. Minimal right infrahilar subsegmental atelectasis is suspected. Electronically Signed   By: David  [place] M.D.   On: 08/03/2016 07:42   Ct Renal Stone Study  Result Date: 08/03/2016 CLINICAL DATA:  71 y/o M; right flank pain. History of chronic kidney disease, appendectomy, and partial colectomy. EXAM: CT ABDOMEN AND PELVIS WITHOUT CONTRAST TECHNIQUE: Multidetector CT imaging of the abdomen and pelvis was performed following the standard protocol without IV contrast. COMPARISON:  None. FINDINGS: Lower chest: AICD lead in the right ventricle. Severe coronary artery calcification. Normal heart size. No pericardial effusion. Small right greater than left pleural effusions. Hepatobiliary: No focal liver abnormality is seen. No gallstones, gallbladder wall thickening, or biliary dilatation. Pancreas: There is indistinctness of fat surrounding the pancreatic and retroperitoneal edema greater on the right. Spleen: Normal in size without focal abnormality. Adrenals/Urinary Tract: Adrenal glands are unremarkable. Kidneys are normal, without renal calculi, focal lesion, or hydronephrosis. Bladder is unremarkable.  Stomach/Bowel: Stomach is within normal limits. Appendix appears normal. No evidence of bowel wall thickening, distention, or inflammatory changes. Few sigmoid diverticular present. Colorectal anastomosis is patent without obstruction. Vascular/Lymphatic: Aortic atherosclerosis. No enlarged abdominal or pelvic  lymph nodes. Reproductive: Prostate is unremarkable. Other: No abdominal wall hernia or abnormality. No abdominopelvic ascites. Musculoskeletal: Moderate spondylosis of the visible thoracolumbar spine. No acute osseous abnormality is evident. IMPRESSION: 1. Indistinctness of pancreatic head and mild nonspecific retroperitoneal edema may represent acute pancreatitis. Correlation with lipase recommended. 2. No hydronephrosis or urinary stone disease. 3. Mild sigmoid diverticulosis. No obstructive or inflammatory changes of bowel identified. 4. Small bilateral pleural effusions. Electronically Signed   By: Mitzi Hansen M.D.   On: 08/03/2016 03:02    Cardiac Studies   Echo pending   Patient Profile     71 y.o. 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 Previous stent placement Problems started with contrast for right shoulder. Transferred with dyspnea acute renal Failure troponin mildly elevated history of ETOH abuse with DT risk  Assessment & Plan    Afib:   -Pt has converrted to sinus rhythm on cardizem. Switched to oral dosing of short acting. Tolerating well with stable BP. Will switch to long acting.  -CHA2DS2/VAS Stroke Risk Points 2 (Age, CAD). With elevated SCr cannot have NOAC.  Pt on heparin and coumadin started, but LFT's elevated today and anticoagulation may not be prudent. Pt has a history of alcohol use.   Elevated Liver enzymes: -Recommend GI consult     CAD:  -no chest pain no need for cath troponin elevated in setting CHF and elevated CR, continue plavix  Acute on chronic renal fialure:   -Per primary service may be related to contrast and cardiorenal    -echo pending to evaluate EF  ETOH:   -DT prophylaxis  -Elevated liver function tests elevated, advise GI consult  Signed, Berton Bon, NP  08/04/2016, 9:24 AM    See my note same day [place] ordered as well as hepatitis panel echo pending to see if EF low enough to suggest Passive liver congestion and cardiorenal syndrome Maintaining NSR for now TEE/DCC canceled  Charlton Haws

## 2016-08-05 ENCOUNTER — Inpatient Hospital Stay (HOSPITAL_COMMUNITY): Payer: Non-veteran care

## 2016-08-05 ENCOUNTER — Encounter (HOSPITAL_COMMUNITY)
Admission: AD | Disposition: A | Discharge status: Home / Self Care | Payer: Self-pay | Source: Other Acute Inpatient Hospital | Attending: Family Medicine

## 2016-08-05 DIAGNOSIS — I342 Nonrheumatic mitral (valve) stenosis: Secondary | ICD-10-CM

## 2016-08-05 LAB — CBC
HCT: 36.6 % — ABNORMAL LOW (ref 39.0–52.0)
HEMOGLOBIN: 11.8 g/dL — AB (ref 13.0–17.0)
MCH: 30.8 pg (ref 26.0–34.0)
MCHC: 32.2 g/dL (ref 30.0–36.0)
MCV: 95.6 fL (ref 78.0–100.0)
PLATELETS: 87 10*3/uL — AB (ref 150–400)
RBC: 3.83 MIL/uL — AB (ref 4.22–5.81)
RDW: 13.8 % (ref 11.5–15.5)
WBC: 8.9 10*3/uL (ref 4.0–10.5)

## 2016-08-05 LAB — RENAL FUNCTION PANEL
ALBUMIN: 3.5 g/dL (ref 3.5–5.0)
Anion gap: 15 (ref 5–15)
BUN: 70 mg/dL — AB (ref 6–20)
CALCIUM: 8.4 mg/dL — AB (ref 8.9–10.3)
CO2: 21 mmol/L — ABNORMAL LOW (ref 22–32)
CREATININE: 2.84 mg/dL — AB (ref 0.61–1.24)
Chloride: 101 mmol/L (ref 101–111)
GFR calc Af Amer: 24 mL/min — ABNORMAL LOW (ref >60)
GFR, EST NON AFRICAN AMERICAN: 21 mL/min — AB (ref >60)
GLUCOSE: 84 mg/dL (ref 65–99)
PHOSPHORUS: 5.1 mg/dL — AB (ref 2.5–4.6)
Potassium: 3.4 mmol/L — ABNORMAL LOW (ref 3.5–5.1)
SODIUM: 137 mmol/L (ref 135–145)

## 2016-08-05 LAB — COMPREHENSIVE METABOLIC PANEL
ALT: 1327 U/L — AB (ref 17–63)
ANION GAP: 12 (ref 5–15)
AST: 738 U/L — ABNORMAL HIGH (ref 15–41)
Albumin: 3.5 g/dL (ref 3.5–5.0)
Alkaline Phosphatase: 84 U/L (ref 38–126)
BUN: 69 mg/dL — ABNORMAL HIGH (ref 6–20)
CHLORIDE: 100 mmol/L — AB (ref 101–111)
CO2: 26 mmol/L (ref 22–32)
CREATININE: 2.89 mg/dL — AB (ref 0.61–1.24)
Calcium: 8.4 mg/dL — ABNORMAL LOW (ref 8.9–10.3)
GFR, EST AFRICAN AMERICAN: 24 mL/min — AB (ref >60)
GFR, EST NON AFRICAN AMERICAN: 20 mL/min — AB (ref >60)
Glucose, Bld: 93 mg/dL (ref 65–99)
Potassium: 3.2 mmol/L — ABNORMAL LOW (ref 3.5–5.1)
Sodium: 138 mmol/L (ref 135–145)
Total Bilirubin: 1.5 mg/dL — ABNORMAL HIGH (ref 0.3–1.2)
Total Protein: 5.9 g/dL — ABNORMAL LOW (ref 6.5–8.1)

## 2016-08-05 LAB — ECHOCARDIOGRAM COMPLETE
Height: 71 in
Weight: 3206.4 oz

## 2016-08-05 LAB — MAGNESIUM: MAGNESIUM: 2.1 mg/dL (ref 1.7–2.4)

## 2016-08-05 LAB — HEPATITIS PANEL, ACUTE
HCV Ab: <0.1 s/co ratio (ref 0.0–0.9)
HEP A IGM: NEGATIVE
HEP B C IGM: NEGATIVE
Hepatitis B Surface Ag: NEGATIVE

## 2016-08-05 LAB — PROTIME-INR
INR: 1.56
PROTHROMBIN TIME: 18.8 s — AB (ref 11.4–15.2)

## 2016-08-05 LAB — POTASSIUM: POTASSIUM: 3.2 mmol/L — AB (ref 3.5–5.1)

## 2016-08-05 LAB — HEPARIN LEVEL (UNFRACTIONATED): HEPARIN UNFRACTIONATED: 0.33 [IU]/mL (ref 0.30–0.70)

## 2016-08-05 SURGERY — ECHOCARDIOGRAM, TRANSESOPHAGEAL
Anesthesia: Moderate Sedation

## 2016-08-05 MED ORDER — FUROSEMIDE 80 MG PO TABS
80.0000 mg | ORAL_TABLET | Freq: Two times a day (BID) | ORAL | Status: DC
  Administered 2016-08-05 (×2): 80 mg via ORAL
  Filled 2016-08-05 (×2): qty 1

## 2016-08-05 MED ORDER — POTASSIUM CHLORIDE CRYS ER 20 MEQ PO TBCR
20.0000 meq | EXTENDED_RELEASE_TABLET | Freq: Two times a day (BID) | ORAL | Status: DC
  Administered 2016-08-05: 20 meq via ORAL
  Filled 2016-08-05 (×2): qty 1

## 2016-08-05 MED ORDER — POTASSIUM CHLORIDE CRYS ER 20 MEQ PO TBCR
20.0000 meq | EXTENDED_RELEASE_TABLET | Freq: Once | ORAL | Status: AC
  Administered 2016-08-05: 20 meq via ORAL

## 2016-08-05 MED ORDER — POTASSIUM CHLORIDE CRYS ER 20 MEQ PO TBCR
40.0000 meq | EXTENDED_RELEASE_TABLET | Freq: Once | ORAL | Status: AC
  Administered 2016-08-05: 40 meq via ORAL
  Filled 2016-08-05: qty 2

## 2016-08-05 MED ORDER — WARFARIN SODIUM 5 MG PO TABS
5.0000 mg | ORAL_TABLET | Freq: Once | ORAL | Status: AC
  Administered 2016-08-05: 5 mg via ORAL
  Filled 2016-08-05: qty 1

## 2016-08-05 NOTE — Progress Notes (Addendum)
Progress Note  Patient Name: Jerry Baker Date of Encounter: 08/05/2016  Primary Cardiologist: Armanda Magic Wants to continue to f/u with [facility] on d/c  Subjective   No dyspnea, chest pain, orthopnea or palpitations. Pt has not been up out of bed as of yet.  Inpatient Medications    Scheduled Meds: . clopidogrel  75 mg Oral Daily  . diltiazem  120 mg Oral Daily  . DULoxetine  90 mg Oral Daily  . folic acid  1 mg Oral Daily  . furosemide  80 mg Oral BID  . metoprolol tartrate  50 mg Oral BID  . multivitamin with minerals  1 tablet Oral Daily  . potassium chloride  20 mEq Oral BID  . thiamine  100 mg Oral Daily   Or  . thiamine  100 mg Intravenous Daily  . Warfarin - Pharmacist Dosing Inpatient   Does not apply q1800   Continuous Infusions: . sodium chloride Stopped (08/03/16 1500)  . heparin 1,200 Units/hr (08/04/16 1617)   PRN Meds: acetaminophen **OR** acetaminophen, LORazepam **OR** LORazepam, promethazine   Vital Signs    Vitals:   08/05/16 0020 08/05/16 0347 08/05/16 0400 08/05/16 0731  BP: 123/73  108/64 117/71  Pulse: (!) 34 73 73 70  Resp: 18 (!) 21 20 17   Temp:   97.9 F (36.6 C) 98.1 F (36.7 C)  TempSrc:    Oral  SpO2: 97% 99% 94% 99%  Weight:   200 lb 6.4 oz (90.9 kg)   Height:        Intake/Output Summary (Last 24 hours) at 08/05/16 0830 Last data filed at 08/05/16 0600  Gross per 24 hour  Intake          1442.67 ml  Output             3700 ml  Net         -2257.33 ml   Filed Weights   08/03/16 0613 08/05/16 0400  Weight: 200 lb 12.8 oz (91.1 kg) 200 lb 6.4 oz (90.9 kg)    Telemetry    NSR with PVCs and NSVT - Personally Reviewed  ECG    No new tracings.  Physical Exam   Affect appropriate Chronically ill white male  HEENT: normal Neck supple with no adenopathy JVP normal no bruits no thyromegaly Lungs clear with no wheezing and good diaphragmatic motion Heart:  S1/S2 no murmur, no rub, gallop or click PMI  normal Abdomen: benign, BS positve, no tenderness, obese no bruit.   Distal pulses intact with no bruits Trace ankle edema Neuro non-focal Skin warm and dry No muscular weakness   Labs    Chemistry  Recent Labs Lab 08/02/16 2157 08/03/16 0422 08/04/16 0231 08/04/16 0600 08/05/16 0324  NA 137 137 136  136  --  138  137  K 5.6* 5.5* 4.8  4.7  --  3.2*  3.4*  CL 105 106 103  103  --  100*  101  CO2 21* 20* 21*  20*  --  26  21*  GLUCOSE 106* 153* 118*  119*  --  93  84  BUN 55* 61* 70*  70*  --  69*  70*  CREATININE 2.79* 3.35* 3.75*  3.86*  --  2.89*  2.84*  CALCIUM 9.2 8.7* 8.3*  8.4*  --  8.4*  8.4*  PROT 6.8  --   --  6.3* 5.9*  ALBUMIN 4.0  --  3.7 3.7 3.5  3.5  AST 59*  --   --  709* 738*  ALT 88*  --   --  938* 1,327*  ALKPHOS 79  --   --  89 84  BILITOT 1.8*  --   --  2.0* 1.5*  GFRNONAA 21* 17* 15*  14*  --  20*  21*  GFRAA 25* 20* 17*  17*  --  24*  24*  ANIONGAP 11 11 12  13   --  12  15     Hematology  Recent Labs Lab 08/03/16 0422 08/03/16 0539 08/03/16 0727 08/04/16 0231 08/05/16 0324  WBC 8.4  --   --  11.8* 8.9  RBC 3.97* 4.07*  --  3.90* 3.83*  HGB 12.4*  --   --  12.3* 11.8*  HCT 37.8*  --   --  37.7* 36.6*  MCV 95.2  --   --  96.7 95.6  MCH 31.2  --   --  31.5 30.8  MCHC 32.8  --   --  32.6 32.2  RDW 13.5  --   --  13.7 13.8  PLT 101*  --  95* 118* 87*    Cardiac Enzymes  Recent Labs Lab 08/02/16 2157 08/03/16 0422 08/03/16 0932 08/04/16 0323  TROPONINI 0.23* 0.23* 0.17* 0.19*   No results for input(s): TROPIPOC in the last 168 hours.   BNP  Recent Labs Lab 08/03/16 0422  BNP 763.3*     DDimer   Recent Labs Lab 08/03/16 0727  DDIMER 3.62*     Radiology    US Abdomen Complete  Result Date: 08/04/2016 CLINICAL DATA:  Elevated liver function tests. EXAM: ABDOMEN ULTRASOUND COMPLETE COMPARISON:  CT abdomen and pelvis 08/03/2016. FINDINGS: Gallbladder: No gallstones or wall thickening  visualized. Small amount of gallbladder sludge is identified. No sonographic Murphy sign noted by sonographer. Common bile duct: Diameter: Proximal common bile duct measures 0.4 cm. Distally, the duct measures 0.9 cm. Liver: No focal lesion identified. Within normal limits in parenchymal echogenicity. IVC: No abnormality visualized. Pancreas: Visualized portion unremarkable. Spleen: Size and appearance within normal limits. Right Kidney: Length: 10.2 cm. Echogenicity within normal limits. 0.9 cm cyst upper pole noted. No hydronephrosis visualized. Left Kidney: Length: 10.3 cm. Echogenicity within normal limits. No mass or hydronephrosis visualized. Abdominal aorta: No aneurysm visualized. Other findings: Right pleural effusion is identified. IMPRESSION: Small amount of gallbladder sludge. Negative for stones or cholecystitis. Negative for intrahepatic biliary ductal dilatation. The distal common bile duct measures 0.9 cm but no stone or other evidence of obstruction is seen. Electronically Signed   By: Drusilla Kanner M.D.   On: 08/04/2016 12:26   Dg Chest Port 1 View  Result Date: 08/04/2016 CLINICAL DATA:  Respiratory crackles in both lungs. Increasing shortness of breath tonight. EXAM: PORTABLE CHEST 1 VIEW COMPARISON:  Radiograph yesterday. FINDINGS: Single lead left-sided pacemaker in place. Cardiomegaly is unchanged. Increasing hazy opacity at the right lung base may be pleural effusion or airspace disease. Increasing pulmonary edema in Kerley B-lines. No pneumothorax. Stable osseous structures. IMPRESSION: Increasing pulmonary edema. Progressive hazy opacity at the right lung base may be increased pleural fluid or airspace disease, atelectasis or pneumonia. Electronically Signed   By: Rubye Oaks M.D.   On: 08/04/2016 01:36    Cardiac Studies   Echo pending   Patient Profile     71 y.o. 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 Previous stent placement Problems started with contrast  for right shoulder. Transferred with dyspnea acute renal Failure troponin mildly elevated history of ETOH  abuse with DT risk  Assessment & Plan    Afib:  Maintaining sinus rhythm with PVCs and NSVT. Continue coumadin and heparin and beta blocker. INR 1.56 today.  CAD: no chest pain no need for cath troponin elevated in setting CHF and elevated CR continue plavix  A/CRF:  Per primary service may be related to contrast and cardiorenal. Echo pending to see how bad EF is . Nephrology has been consulted-no IV contrast was used for shoulder CT. SCr improving and urine output good. Nephro suspects cause of ARF is ATN from episodic hypotension in face of atrial fib. They have changes lasix to oral dosing.   ETOH:  DT prophylaxis   LFT;s:  He has been drinking beer heavily for last 5-6 months hepatitis panel ordered as well as US of abdomen. GI consult doubt this is passive liver congestion from CHF will d/c statin for now. LFT's slightly higher today. Thought was that possibly related to shock liver, however, levels should be trending down.  Hypokalemia:  K+  3.2. Potassium supplementation ordered by IM.   Signed, Berton BonJanine Hammond, NP  08/05/2016, 8:30 AM    Cardiac status appears stable lungs clear have called echo lab to get test done today Hepatitis panel negative US no obstructive GB disease statin stopped LFTl elevation Possibly from ETOH  Rhythm stable with asymptomatic NSVT and no afib continue beta blocker  Charlton HawsPeter Aries Kasa

## 2016-08-05 NOTE — Progress Notes (Signed)
    Called by Sharia ReeveJosh, RN about a brief run of torsades noted on tele. Pt asymptomatic. K was low earlier but being repleted. WIll check another K and Mag. QTc normal on ECG from today. Continue to monitor.   Cline CrockKathryn Marlicia Sroka PA-C  [facility]

## 2016-08-05 NOTE — Progress Notes (Signed)
PROGRESS NOTE    Lorne Winkels  WUJ:[id number] DOB: 04-02-1945 DOA: 08/02/2016 PCP: System, Pcp Not In    Brief Narrative: [name] a 71 y.o.malewith history of CAD status post stenting, cardiomyopathy last EF measured was 40% with AICD and pacemaker placement, peripheral neuropathy presented to the ER at [facility] with complaints of shortness of breath. He was found to be in afib with RVR, acute renal failure and in acute pancreatitis.   Assessment & Plan:   Principal Problem:   Atrial fibrillation with RVR (HCC) Active Problems:   Cardiomyopathy (HCC)   Diverticulitis   Peripheral neuropathy   Coronary artery disease involving native coronary artery of native heart without angina pectoris   AKI (acute kidney injury) (HCC)   Elevated troponin   Atrial fibrillation with RVR:  Admitted to step down, started on IV heparin, cardizem and coumadin started.  Cardiology consulted and echocardiogram ordered, pending.    Acute kidney Injury:  Unclear etiology, possibly pre renal, vs contrast induced.  Nephrology on board.  Improving, creatinine is 2.84.    Acute respiratory failure secondary to pulmonary edema:  Improving, off oxygen. Diuresed about 3.6 lit since admission on IV lasix gtt.  Foley , strict intake and out put.  Lasix gtt and monitor urine output.  Awaiting records from [place].  Nephrology consulted and recommendations given.    Alcohol abuse: on CIWA . NO withdrawal symptoms.    Elevated liver function tests:  US abdomen shows some gall bladder sludge and CBD at 0.9 cm, no stones or evidence of obstruction.  He reports his abdominal discomfort has improved, requesting food. Started him on soft diet.  GI consulted for evaluation of elevated lfts's   Acute mild pancreatitis appears to be improving, lipase levels wnl.  And his abd pain is improving.  Started on clears and advanced diet  as tolerated.   Elevated troponin's: demand ischemia from  pulmonary edema.    Mild anemia, thrombocytopenia: elevated LDH.  Monitor.   h/o DCM with AICD;  Fluid overloaded, on IV lasix.  Repeat echocardiogram for evaluation of LVEF.    DVT prophylaxis: heparin gtt.  Code Status: full code.  Family Communication: none at bedside.  Disposition Plan: pending PT evaluation.    Consultants:   Cardiology  nephrology  Procedures: echocardiogram  US abdomen.    Antimicrobials: none    Subjective:  No abd pain, no nausea or vomiting , no chest pain or sob.  Objective: Vitals:   08/05/16 0400 08/05/16 0731 08/05/16 0840 08/05/16 1108  BP: 108/64 117/71 104/69 103/77  Pulse: 73 70 75 69  Resp: 20 17 18 18   Temp: 97.9 F (36.6 C) 98.1 F (36.7 C)  98.1 F (36.7 C)  TempSrc:  Oral  Oral  SpO2: 94% 99% 96% 97%  Weight: 90.9 kg (200 lb 6.4 oz)     Height:        Intake/Output Summary (Last 24 hours) at 08/05/16 1442 Last data filed at 08/05/16 1017  Gross per 24 hour  Intake          1022.67 ml  Output             5200 ml  Net         -4177.33 ml   Filed Weights   08/03/16 0613 08/05/16 0400  Weight: 91.1 kg (200 lb 12.8 oz) 90.9 kg (200 lb 6.4 oz)    Examination:  General exam: comfortable, no new complaints.  Respiratory system: Clear to auscultation.  Respiratory effort normal. Cardiovascular system: S1 & S2 heard, RRR. No JVD, murmurs, rubs, gallops or clicks. Trace pedal edema.  Gastrointestinal system: Abdomen is nondistended,soft and mildly tender. . No organomegaly or masses felt. Normal bowel sounds heard. Central nervous system: Alert and oriented. No focal neurological deficits. Extremities: Symmetric 5 x 5 power. Skin: No rashes, lesions or ulcers Psychiatry: Judgement and insight appear normal. Flat affect and mood.      Data Reviewed: I have personally reviewed following labs and imaging studies  CBC:  Recent Labs Lab 08/02/16 2157 08/03/16 0422 08/03/16 0727 08/04/16 0231 08/05/16 0324  WBC  10.4 8.4  --  11.8* 8.9  NEUTROABS 7.4  --   --   --   --   HGB 12.9* 12.4*  --  12.3* 11.8*  HCT 38.8* 37.8*  --  37.7* 36.6*  MCV 95.8 95.2  --  96.7 95.6  PLT 108* 101* 95* 118* 87*   Basic Metabolic Panel:  Recent Labs Lab 08/02/16 2157 08/03/16 0422 08/04/16 0231 08/05/16 0324  NA 137 137 136  136 138  137  K 5.6* 5.5* 4.8  4.7 3.2*  3.4*  CL 105 106 103  103 100*  101  CO2 21* 20* 21*  20* 26  21*  GLUCOSE 106* 153* 118*  119* 93  84  BUN 55* 61* 70*  70* 69*  70*  CREATININE 2.79* 3.35* 3.75*  3.86* 2.89*  2.84*  CALCIUM 9.2 8.7* 8.3*  8.4* 8.4*  8.4*  MG  --  2.4  --   --   PHOS  --   --  7.3* 5.1*   GFR: Estimated Creatinine Clearance: 27.5 mL/min (A) (by C-G formula based on SCr of 2.84 mg/dL (H)). Liver Function Tests:  Recent Labs Lab 08/02/16 2157 08/04/16 0231 08/04/16 0600 08/05/16 0324  AST 59*  --  709* 738*  ALT 88*  --  938* 1,327*  ALKPHOS 79  --  89 84  BILITOT 1.8*  --  2.0* 1.5*  PROT 6.8  --  6.3* 5.9*  ALBUMIN 4.0 3.7 3.7 3.5  3.5    Recent Labs Lab 08/03/16 0539  LIPASE 21   No results for input(s): AMMONIA in the last 168 hours. Coagulation Profile:  Recent Labs Lab 08/03/16 0727 08/04/16 0231 08/05/16 0324  INR 1.59 1.78 1.56   Cardiac Enzymes:  Recent Labs Lab 08/02/16 2157 08/03/16 0422 08/03/16 0932 08/04/16 0323  TROPONINI 0.23* 0.23* 0.17* 0.19*   BNP (last 3 results) No results for input(s): PROBNP in the last 8760 hours. HbA1C: No results for input(s): HGBA1C in the last 72 hours. CBG: No results for input(s): GLUCAP in the last 168 hours. Lipid Profile:  Recent Labs  08/02/16 2157  CHOL 145  HDL 47  LDLCALC 86  TRIG 59  CHOLHDL 3.1   Thyroid Function Tests:  Recent Labs  08/02/16 2157  TSH 6.757*   Anemia Panel:  Recent Labs  08/03/16 0539  VITAMINB12 5,493*  FOLATE 59.7  FERRITIN 1,209*  TIBC 318  IRON 129  RETICCTPCT 1.6   Sepsis Labs:  Recent Labs Lab  08/03/16 0422  LATICACIDVEN 1.9    No results found for this or any previous visit (from the past 240 hour(s)).       Radiology Studies: US Abdomen Complete  Result Date: 08/04/2016 CLINICAL DATA:  Elevated liver function tests. EXAM: ABDOMEN ULTRASOUND COMPLETE COMPARISON:  CT abdomen and pelvis 08/03/2016. FINDINGS: Gallbladder: No gallstones or wall thickening visualized.  Small amount of gallbladder sludge is identified. No sonographic Murphy sign noted by sonographer. Common bile duct: Diameter: Proximal common bile duct measures 0.4 cm. Distally, the duct measures 0.9 cm. Liver: No focal lesion identified. Within normal limits in parenchymal echogenicity. IVC: No abnormality visualized. Pancreas: Visualized portion unremarkable. Spleen: Size and appearance within normal limits. Right Kidney: Length: 10.2 cm. Echogenicity within normal limits. 0.9 cm cyst upper pole noted. No hydronephrosis visualized. Left Kidney: Length: 10.3 cm. Echogenicity within normal limits. No mass or hydronephrosis visualized. Abdominal aorta: No aneurysm visualized. Other findings: Right pleural effusion is identified. IMPRESSION: Small amount of gallbladder sludge. Negative for stones or cholecystitis. Negative for intrahepatic biliary ductal dilatation. The distal common bile duct measures 0.9 cm but no stone or other evidence of obstruction is seen. Electronically Signed   By: Drusilla Kannerhomas  Dalessio M.D.   On: 08/04/2016 12:26   Dg Chest Port 1 View  Result Date: 08/04/2016 CLINICAL DATA:  Respiratory crackles in both lungs. Increasing shortness of breath tonight. EXAM: PORTABLE CHEST 1 VIEW COMPARISON:  Radiograph yesterday. FINDINGS: Single lead left-sided pacemaker in place. Cardiomegaly is unchanged. Increasing hazy opacity at the right lung base may be pleural effusion or airspace disease. Increasing pulmonary edema in Kerley B-lines. No pneumothorax. Stable osseous structures. IMPRESSION: Increasing pulmonary edema.  Progressive hazy opacity at the right lung base may be increased pleural fluid or airspace disease, atelectasis or pneumonia. Electronically Signed   By: Rubye OaksMelanie  Ehinger M.D.   On: 08/04/2016 01:36        Scheduled Meds: . clopidogrel  75 mg Oral Daily  . diltiazem  120 mg Oral Daily  . DULoxetine  90 mg Oral Daily  . folic acid  1 mg Oral Daily  . furosemide  80 mg Oral BID  . metoprolol tartrate  50 mg Oral BID  . multivitamin with minerals  1 tablet Oral Daily  . potassium chloride  20 mEq Oral BID  . thiamine  100 mg Oral Daily   Or  . thiamine  100 mg Intravenous Daily  . warfarin  5 mg Oral ONCE-1800  . Warfarin - Pharmacist Dosing Inpatient   Does not apply q1800   Continuous Infusions: . sodium chloride Stopped (08/03/16 1500)  . heparin 1,200 Units/hr (08/05/16 1426)     LOS: 3 days    Time spent: 35 minutes.     Kathlen ModyAKULA,Karanveer Ramakrishnan, MD [facility] Pager [phone]   If 7PM-7AM, please contact night-coverage [email or web address] Password [email or web address] 08/05/2016, 2:42 PM

## 2016-08-05 NOTE — Plan of Care (Signed)
Problem: Safety: Goal: Ability to remain free from injury will improve Outcome: Progressing Fall risk bundle in place. No injuries this shift.    

## 2016-08-05 NOTE — Progress Notes (Signed)
S: Breathing better this AM O:BP 108/64   Pulse 73   Temp 97.9 F (36.6 C)   Resp 20   Ht 5\' 11"  (1.803 m)   Wt 90.9 kg (200 lb 6.4 oz)   SpO2 94%   BMI 27.95 kg/m   Intake/Output Summary (Last 24 hours) at 08/05/16 0643 Last data filed at 08/05/16 0600  Gross per 24 hour  Intake          1442.67 ml  Output             4250 ml  Net         -2807.33 ml   Weight change:  ZOX:WRUEAGen:awake and alert CVS: Irregular with monitor showing PVC's Resp: fewer crackles Abd:+ BS NTND Ext: No edema NEURO:CNI Ox3 no asterixis   . clopidogrel  75 mg Oral Daily  . diltiazem  120 mg Oral Daily  . DULoxetine  90 mg Oral Daily  . folic acid  1 mg Oral Daily  . metoprolol tartrate  50 mg Oral BID  . multivitamin with minerals  1 tablet Oral Daily  . thiamine  100 mg Oral Daily   Or  . thiamine  100 mg Intravenous Daily  . Warfarin - Pharmacist Dosing Inpatient   Does not apply q1800   [place] Abdomen Complete  Result Date: 08/04/2016 CLINICAL DATA:  Elevated liver function tests. EXAM: ABDOMEN ULTRASOUND COMPLETE COMPARISON:  CT abdomen and pelvis 08/03/2016. FINDINGS: Gallbladder: No gallstones or wall thickening visualized. Small amount of gallbladder sludge is identified. No sonographic Murphy sign noted by sonographer. Common bile duct: Diameter: Proximal common bile duct measures 0.4 cm. Distally, the duct measures 0.9 cm. Liver: No focal lesion identified. Within normal limits in parenchymal echogenicity. IVC: No abnormality visualized. Pancreas: Visualized portion unremarkable. Spleen: Size and appearance within normal limits. Right Kidney: Length: 10.2 cm. Echogenicity within normal limits. 0.9 cm cyst upper pole noted. No hydronephrosis visualized. Left Kidney: Length: 10.3 cm. Echogenicity within normal limits. No mass or hydronephrosis visualized. Abdominal aorta: No aneurysm visualized. Other findings: Right pleural effusion is identified. IMPRESSION: Small amount of gallbladder sludge. Negative  for stones or cholecystitis. Negative for intrahepatic biliary ductal dilatation. The distal common bile duct measures 0.9 cm but no stone or other evidence of obstruction is seen. Electronically Signed   By: Drusilla Kannerhomas  Dalessio M.D.   On: 08/04/2016 12:26   Dg Chest Port 1 View  Result Date: 08/04/2016 CLINICAL DATA:  Respiratory crackles in both lungs. Increasing shortness of breath tonight. EXAM: PORTABLE CHEST 1 VIEW COMPARISON:  Radiograph yesterday. FINDINGS: Single lead left-sided pacemaker in place. Cardiomegaly is unchanged. Increasing hazy opacity at the right lung base may be pleural effusion or airspace disease. Increasing pulmonary edema in Kerley B-lines. No pneumothorax. Stable osseous structures. IMPRESSION: Increasing pulmonary edema. Progressive hazy opacity at the right lung base may be increased pleural fluid or airspace disease, atelectasis or pneumonia. Electronically Signed   By: Rubye OaksMelanie  Ehinger M.D.   On: 08/04/2016 01:36   Dg Chest Port 1 View  Result Date: 08/03/2016 CLINICAL DATA:  Shortness of breath, history of CHF, cardiomyopathy, coronary artery disease, chronic renal insufficiency. EXAM: PORTABLE CHEST 1 VIEW COMPARISON:  None in PACs FINDINGS: The lungs are well-expanded. The interstitial markings are mildly prominent especially at the right lung base. The cardiac silhouette is enlarged. The pulmonary vascularity mildly engorged. The ICD is in stable position. There is calcification in the wall of the aortic arch. The bony thorax exhibits no acute abnormality.  IMPRESSION: CHF with mild pulmonary interstitial edema. Minimal right infrahilar subsegmental atelectasis is suspected. Electronically Signed   By: David  [place] M.D.   On: 08/03/2016 07:42   BMET    Component Value Date/Time   NA 137 08/05/2016 0324   NA 138 08/05/2016 0324   K 3.4 (L) 08/05/2016 0324   K 3.2 (L) 08/05/2016 0324   CL 101 08/05/2016 0324   CL 100 (L) 08/05/2016 0324   CO2 21 (L) 08/05/2016 0324    CO2 26 08/05/2016 0324   GLUCOSE 84 08/05/2016 0324   GLUCOSE 93 08/05/2016 0324   BUN 70 (H) 08/05/2016 0324   BUN 69 (H) 08/05/2016 0324   CREATININE 2.84 (H) 08/05/2016 0324   CREATININE 2.89 (H) 08/05/2016 0324   CALCIUM 8.4 (L) 08/05/2016 0324   CALCIUM 8.4 (L) 08/05/2016 0324   GFRNONAA 21 (L) 08/05/2016 0324   GFRNONAA 20 (L) 08/05/2016 0324   GFRAA 24 (L) 08/05/2016 0324   GFRAA 24 (L) 08/05/2016 0324   CBC    Component Value Date/Time   WBC 8.9 08/05/2016 0324   RBC 3.83 (L) 08/05/2016 0324   HGB 11.8 (L) 08/05/2016 0324   HCT 36.6 (L) 08/05/2016 0324   PLT 87 (L) 08/05/2016 0324   MCV 95.6 08/05/2016 0324   MCH 30.8 08/05/2016 0324   MCHC 32.2 08/05/2016 0324   RDW 13.8 08/05/2016 0324   LYMPHSABS 2.2 08/02/2016 2157   MONOABS 0.7 08/02/2016 2157   EOSABS 0.0 08/02/2016 2157   BASOSABS 0.0 08/02/2016 2157     Assessment: 1. Acute Renal failure  The CT scan he had at the [facility] was a CT arthrogram of shoulder so no IV contrast was used.  Scr improving and UO excellent.  Suspect the cause of ARF is ATN from episodic hypotension in face of A fib 2. A fib, now NSR 3. Thrombocytopenia, sl improved 4. Hx CAD 5. Pulm edema 6. hypokalemia Plan: 1. Replace K 2. Change to PO lasix 3. Daily labs   Jerry Baker

## 2016-08-05 NOTE — Progress Notes (Signed)
  Echocardiogram 2D Echocardiogram has been performed.  Jerry Baker [name] 08/05/2016, 3:31 PM

## 2016-08-05 NOTE — Progress Notes (Signed)
ANTICOAGULATION CONSULT NOTE - follow up Pharmacy Consult for Heparin and Coumadn Indication: atrial fibrillation  Allergies  Allergen Reactions  . Penicillins Other (See Comments)    Unknown    Patient Measurements: Height: 5\' 11"  (180.3 cm) Weight: 200 lb 6.4 oz (90.9 kg) IBW/kg (Calculated) : 75.3Weight = 91 kg   Vital Signs: Temp: 98.1 F (36.7 C) (05/10 1108) Temp Source: Oral (05/10 1108) BP: 103/77 (05/10 1108) Pulse Rate: 69 (05/10 1108)  Labs:  Recent Labs  08/03/16 0422  08/03/16 0727 08/03/16 0932 08/03/16 1352 08/04/16 0231 08/04/16 0323 08/05/16 0324  HGB 12.4*  --   --   --   --  12.3*  --  11.8*  HCT 37.8*  --   --   --   --  37.7*  --  36.6*  PLT 101*  --  95*  --   --  118*  --  87*  APTT  --   --  113*  --   --   --   --   --   LABPROT  --   --  19.1*  --   --  21.0*  --  18.8*  INR  --   --  1.59  --   --  1.78  --  1.56  HEPARINUNFRC  --   < >  --   --  0.51 0.45  --  0.33  CREATININE 3.35*  --   --   --   --  3.75*  3.86*  --  2.89*  2.84*  TROPONINI 0.23*  --   --  0.17*  --   --  0.19*  --   < > = values in this interval not displayed.  Estimated Creatinine Clearance: 27.5 mL/min (A) (by C-G formula based on SCr of 2.84 mg/dL (H)).   Medical History: Past Medical History:  Diagnosis Date  . Cardiomyopathy (HCC)    EF 40% s/p dual chamber AICD  . CHF (congestive heart failure) (HCC)   . CKD (chronic kidney disease)    told by his PCP about 8 months ago and was seen by a nephrologist and his ACE I was stopped.    . Coronary artery disease 2012   s/p MI 2012 with PCI and 2 stents  . Diverticulitis   . Peripheral neuropathy      Assessment: 71 y.o male [facility] with new onset Afib RVR and AKI. Pharmacy was consulted on 08/02/16 to manage heparin dosing. Baseline hgb 13.1, pltc 106K.  No oral anticoagulation PTA.   Coumadin started on 08/03/16, continue heparin bridge.  Heparin level is 0.33, remains  therapeutic on heparin drip 1200 units/hr.  INR =1.56, decreased from 1.78 yesterday. No bleeding reported. Hgb low stable with Hgb decrease to 11.8 today. PLTC low/stable [id number]>118>87k Elevated LFTs: slightly higher today. AKI: Scr decreased to 2.89   Goal of Therapy:  INR 2-3 Heparin level 0.3-0.7 units/ml Monitor platelets by anticoagulation protocol: Yes   Plan:  Continue Heparin infusion 1200 units/hr  Coumadin 5 mg po x1 Daily HL, INR and CBC. Educate patient about coumadin. Book/video ordered previously.  Thank you for allowing pharmacy to be part of this patients care team. Noah Delaineuth Elijahjames Fuelling, RPh Clinical Pharmacist Pager: [phone]-10P [phone] Main Pharmacy [phone] 08/05/2016,12:19 PM

## 2016-08-05 NOTE — Progress Notes (Signed)
GASTROENTEROLOGY PROGRESS NOTE  Problem:   Elevated liver chemistries  Subjective: Feels fine  Objective: Ultrasound yesterday showed no hepatic parenchymal abnormalities such as steatosis or tumors, nor evident biliary tract disease such as gallstones or ductal dilatation.  Transaminases continue to rise moderately, although total bilirubin level has actually dropped slightly.  The patient is lying in bed in no distress, anicteric, very alert and coherent, no evidence of overt encephalopathy.  Assessment: Cause of elevated liver chemistries is unclear, but I agree with cardiology that it could conceivably be the statin that was started on this hospitalization. If so, the rapidity and severity of the rise would be somewhat atypical.  I doubt that this is due to alcohol, because of the degree of transaminase elevation, the rapidity of rise while in the hospital despite alcohol cessation, and the absence of an OT/PT split.  My initial impression was that this was a delayed rise in liver chemistries as a "reactive hepatopathy," or "shock liver." That is still possible, but if so, I would definitely expect the transaminases to "crest" in the next one to 2 days. On the other hand, if the rise in liver chemistries was the result of a reaction to his Lipitor, it may take days to weeks for the liver chemistries to start coming down.    Plan: 1. Agree with stopping Lipitor  2. I am encouraged by the absence of worrisome findings such as jaundice, encephalopathy, or rise in his prothrombin time (actually somewhat improved today at 1.56 INR). Therefore, despite the rise in transaminases, there is no clinical or biochemical evidence of impending hepatic failure. We will continue to monitor his blood work, but if he continues to do well clinically, discharge home in a couple of days, from the standpoint of his liver condition, may be possible even if his transaminases remain elevated, as long as he has  very close follow-up to monitor for evidence of hepatic failure.  Florencia Reasonsobert V. Ahmaud Duthie, M.D. 08/05/2016 10:05 AM  Pager [phone] If no answer or after 5 PM call [phone]

## 2016-08-05 NOTE — Progress Notes (Signed)
Pt had run of vtach.  Assessed pt, VS stable, BP 123/73, HR 73, 02 99% on 2L [place], Resp 19, denies chest pain. Ekg obtained and uploaded into pt's chart. Cardiology notified. Per Dr. Adriana ReamsBensimone, re-page cardiology if Vtach persists for 20 beats or greater.  [facility] and requested that strip with Vtach be uploaded into pt's chart.Will continue to monitor and assess pt.

## 2016-08-05 NOTE — Progress Notes (Signed)
Patient had about a six second run of torsades, patient asymptomatic.  Last potassium resulted at 3.2.  Cardiology paged with this information.  Fudim returned page and updated, order received.

## 2016-08-06 DIAGNOSIS — R7989 Other specified abnormal findings of blood chemistry: Secondary | ICD-10-CM

## 2016-08-06 DIAGNOSIS — R748 Abnormal levels of other serum enzymes: Secondary | ICD-10-CM

## 2016-08-06 LAB — HEPARIN LEVEL (UNFRACTIONATED): Heparin Unfractionated: 0.32 IU/mL (ref 0.30–0.70)

## 2016-08-06 LAB — CBC
HCT: 36.8 % — ABNORMAL LOW (ref 39.0–52.0)
Hemoglobin: 12.2 g/dL — ABNORMAL LOW (ref 13.0–17.0)
MCH: 31.8 pg (ref 26.0–34.0)
MCHC: 33.2 g/dL (ref 30.0–36.0)
MCV: 95.8 fL (ref 78.0–100.0)
PLATELETS: 87 10*3/uL — AB (ref 150–400)
RBC: 3.84 MIL/uL — AB (ref 4.22–5.81)
RDW: 13.7 % (ref 11.5–15.5)
WBC: 6.9 10*3/uL (ref 4.0–10.5)

## 2016-08-06 LAB — COMPREHENSIVE METABOLIC PANEL
ALBUMIN: 3.5 g/dL (ref 3.5–5.0)
ALT: 1220 U/L — AB (ref 17–63)
ANION GAP: 11 (ref 5–15)
AST: 483 U/L — ABNORMAL HIGH (ref 15–41)
Alkaline Phosphatase: 89 U/L (ref 38–126)
BUN: 60 mg/dL — ABNORMAL HIGH (ref 6–20)
CO2: 32 mmol/L (ref 22–32)
Calcium: 8.7 mg/dL — ABNORMAL LOW (ref 8.9–10.3)
Chloride: 95 mmol/L — ABNORMAL LOW (ref 101–111)
Creatinine, Ser: 2.43 mg/dL — ABNORMAL HIGH (ref 0.61–1.24)
GFR calc non Af Amer: 25 mL/min — ABNORMAL LOW (ref >60)
GFR, EST AFRICAN AMERICAN: 29 mL/min — AB (ref >60)
GLUCOSE: 100 mg/dL — AB (ref 65–99)
Potassium: 3.4 mmol/L — ABNORMAL LOW (ref 3.5–5.1)
Sodium: 138 mmol/L (ref 135–145)
Total Bilirubin: 1.7 mg/dL — ABNORMAL HIGH (ref 0.3–1.2)
Total Protein: 6.1 g/dL — ABNORMAL LOW (ref 6.5–8.1)

## 2016-08-06 LAB — BASIC METABOLIC PANEL
ANION GAP: 10 (ref 5–15)
BUN: 55 mg/dL — ABNORMAL HIGH (ref 6–20)
CHLORIDE: 98 mmol/L — AB (ref 101–111)
CO2: 30 mmol/L (ref 22–32)
CREATININE: 2.12 mg/dL — AB (ref 0.61–1.24)
Calcium: 9 mg/dL (ref 8.9–10.3)
GFR calc Af Amer: 34 mL/min — ABNORMAL LOW (ref >60)
GFR, EST NON AFRICAN AMERICAN: 30 mL/min — AB (ref >60)
Glucose, Bld: 153 mg/dL — ABNORMAL HIGH (ref 65–99)
Potassium: 3.9 mmol/L (ref 3.5–5.1)
Sodium: 138 mmol/L (ref 135–145)

## 2016-08-06 LAB — PROTIME-INR
INR: 1.45
Prothrombin Time: 17.8 seconds — ABNORMAL HIGH (ref 11.4–15.2)

## 2016-08-06 MED ORDER — WARFARIN SODIUM 5 MG PO TABS
6.0000 mg | ORAL_TABLET | Freq: Once | ORAL | Status: AC
  Administered 2016-08-06: 18:00:00 6 mg via ORAL
  Filled 2016-08-06: qty 1

## 2016-08-06 MED ORDER — POTASSIUM CHLORIDE CRYS ER 20 MEQ PO TBCR
60.0000 meq | EXTENDED_RELEASE_TABLET | Freq: Once | ORAL | Status: AC
  Administered 2016-08-06: 60 meq via ORAL
  Filled 2016-08-06: qty 3

## 2016-08-06 MED ORDER — POTASSIUM CHLORIDE CRYS ER 20 MEQ PO TBCR
20.0000 meq | EXTENDED_RELEASE_TABLET | Freq: Once | ORAL | Status: AC
  Administered 2016-08-06: 20 meq via ORAL
  Filled 2016-08-06: qty 1

## 2016-08-06 NOTE — Progress Notes (Signed)
Pt feels fine and LFT's appear to have "crested" and are improved today.  Hopefully this trend will continue.  INR improved, pt alert and coherent, no evid of hepatic failure.  In my opinion, the severity and time course of this pt's transaminase elevation is more c/w "shock liver" or reactive hepatopathy than a reaction to his Lipitor, especially since they LFT's were significantly elevated within 1 day of admission.  Florencia Reasonsobert V. Aliciana Ricciardi, M.D. Pager [phone] If no answer or after 5 PM call [phone]

## 2016-08-06 NOTE — Progress Notes (Signed)
PROGRESS NOTE    Jerry Baker  ZOX:[id number] DOB: [birth year] DOA: 08/02/2016 PCP: System, Pcp Not In    Brief Narrative: Jerry Baker a 71 y.o.malewith history of CAD status post stenting, cardiomyopathy last EF measured was 40% with AICD and pacemaker placement, peripheral neuropathy presented to the ER at [facility] with complaints of shortness of breath. He was found to be in afib with RVR, acute renal failure and in acute pancreatitis.   Assessment & Plan:   Principal Problem:   Atrial fibrillation with RVR (HCC) Active Problems:   Cardiomyopathy (HCC)   Diverticulitis   Peripheral neuropathy   Coronary artery disease involving native coronary artery of native heart without angina pectoris   AKI (acute kidney injury) (HCC)   Elevated troponin  Atrial fibrillation with RVR and episodes of Torsades on 5/10: Maintaining NSR currently. AICD interrogation showed nonsustained polymorphic VTach which spontaneously terminated < 1 sec prior to shock from AICD.  - Remain in SDU - EP consulted - Keep K > 4 and Mg > 2 - Continue telemetry - Heparin bridge to coumadin started - Continue cardizem  Cardiomyopathy and acute on chronic systolic CHF: EF 81-19% - Per cardiology. No intervention definitely planned at this time. Troponin elevation thought to be due to demand ischemia.  - Holding lasix 5/11, negative balance obtained. DC foley since no longer diuresing aggressively.  Acute kidney Injury: Unclear etiology, possibly pre renal, vs contrast induced. Improved with diuresis.  - Hold lasix today and monitor per nephrology recommendation.   - Avoid nephrotoxins  Alcohol abuse: CIWA scores have consistently remained zero.  - DC CIWA.  - Continue MVM's   Elevated liver function tests: US abdomen shows some gall bladder sludge and CBD at 0.9 cm, no stones or evidence of obstruction. - GI consulted, suspect due to shock liver/reactive hepatopathy, since improving.  Precipitous improvement not consistent with statin-induced hepatopathy - Monitor, improving.  Acute mild pancreatitis appears to be improving, lipase levels wnl. No abd pain.  - Tolerating diet, resolved.   Mild anemia: AOCD, mild, stable. Elevated LDH likely not indicative of hemolysis.  - Monitor  Acute thrombocytopenia: Heparin and antiplatelets still ok if plt >50k per oncology.  - Monitor. Dr. Bertis Ruddy has evaluated patient.   h/o DCM with AICD;  Fluid overloaded, on IV lasix.  Repeat echocardiogram for evaluation of LVEF.   DVT prophylaxis: Heparin gtt, coumadin.  Code Status: Full Family Communication: Wife, Son, and DIL at bedside Disposition Plan: pending PT evaluation.   Consultants:   Cardiology  Nephrology  Gastroenterology, Dr. Matthias Hughs  Procedures:  Echocardiogram  US abdomen.   Antimicrobials: none   Subjective: No new complaints or overnight events.   Objective: Vitals:   08/06/16 0451 08/06/16 0730 08/06/16 1122 08/06/16 1613  BP: 101/68 112/73 109/65 107/67  Pulse: 72 74 70 65  Resp: 17 16 20  (!) 21  Temp: 98 F (36.7 C) 98.2 F (36.8 C) 97.9 F (36.6 C) 97.7 F (36.5 C)  TempSrc:  Axillary Oral Oral  SpO2: 92% (!) 89% 94% 98%  Weight: 86.6 kg (191 lb)     Height:        Intake/Output Summary (Last 24 hours) at 08/06/16 1931 Last data filed at 08/06/16 1852  Gross per 24 hour  Intake           1566.4 ml  Output             4300 ml  Net          -  2733.6 ml   Filed Weights   08/03/16 0613 08/05/16 0400 08/06/16 0451  Weight: 91.1 kg (200 lb 12.8 oz) 90.9 kg (200 lb 6.4 oz) 86.6 kg (191 lb)   Examination: General exam: 70yo M sitting in chair eating in no distress Respiratory system: Clear to auscultation. Respiratory effort normal. Cardiovascular system: S1 & S2 heard, RRR. No JVD, murmurs, rubs, gallops or clicks.  Gastrointestinal system: Abdomen is soft, nontender and nondistended. No organomegaly or masses felt. Normal bowel  sounds heard. Central nervous system: Alert and oriented. No focal neurological deficits. Extremities: No edema or deformities. Skin: No rashes, lesions or ulcers Psychiatry: Judgement and insight appear normal. Euthymic mood, congruent affect.    Data Reviewed: I have personally reviewed following labs and imaging studies  CBC:  Recent Labs Lab 08/02/16 2157 08/03/16 0422 08/03/16 0727 08/04/16 0231 08/05/16 0324 08/06/16 0359  WBC 10.4 8.4  --  11.8* 8.9 6.9  NEUTROABS 7.4  --   --   --   --   --   HGB 12.9* 12.4*  --  12.3* 11.8* 12.2*  HCT 38.8* 37.8*  --  37.7* 36.6* 36.8*  MCV 95.8 95.2  --  96.7 95.6 95.8  PLT 108* 101* 95* 118* 87* 87*   Basic Metabolic Panel:  Recent Labs Lab 08/03/16 0422 08/04/16 0231 08/05/16 0324 08/05/16 1839 08/06/16 0359 08/06/16 1455  NA 137 136  136 138  137  --  138 138  K 5.5* 4.8  4.7 3.2*  3.4* 3.2* 3.4* 3.9  CL 106 103  103 100*  101  --  95* 98*  CO2 20* 21*  20* 26  21*  --  32 30  GLUCOSE 153* 118*  119* 93  84  --  100* 153*  BUN 61* 70*  70* 69*  70*  --  60* 55*  CREATININE 3.35* 3.75*  3.86* 2.89*  2.84*  --  2.43* 2.12*  CALCIUM 8.7* 8.3*  8.4* 8.4*  8.4*  --  8.7* 9.0  MG 2.4  --   --  2.1  --   --   PHOS  --  7.3* 5.1*  --   --   --    GFR: Estimated Creatinine Clearance: 34 mL/min (A) (by C-G formula based on SCr of 2.12 mg/dL (H)). Liver Function Tests:  Recent Labs Lab 08/02/16 2157 08/04/16 0231 08/04/16 0600 08/05/16 0324 08/06/16 0359  AST 59*  --  709* 738* 483*  ALT 88*  --  938* 1,327* 1,220*  ALKPHOS 79  --  89 84 89  BILITOT 1.8*  --  2.0* 1.5* 1.7*  PROT 6.8  --  6.3* 5.9* 6.1*  ALBUMIN 4.0 3.7 3.7 3.5  3.5 3.5    Recent Labs Lab 08/03/16 0539  LIPASE 21   No results for input(s): AMMONIA in the last 168 hours. Coagulation Profile:  Recent Labs Lab 08/03/16 0727 08/04/16 0231 08/05/16 0324 08/06/16 0359  INR 1.59 1.78 1.56 1.45   Cardiac Enzymes:  Recent  Labs Lab 08/02/16 2157 08/03/16 0422 08/03/16 0932 08/04/16 0323  TROPONINI 0.23* 0.23* 0.17* 0.19*   BNP (last 3 results) No results for input(s): PROBNP in the last 8760 hours. HbA1C: No results for input(s): HGBA1C in the last 72 hours. CBG: No results for input(s): GLUCAP in the last 168 hours. Lipid Profile: No results for input(s): CHOL, HDL, LDLCALC, TRIG, CHOLHDL, LDLDIRECT in the last 72 hours. Thyroid Function Tests: No results for input(s): TSH, T4TOTAL, FREET4,  T3FREE, THYROIDAB in the last 72 hours. Anemia Panel: No results for input(s): VITAMINB12, FOLATE, FERRITIN, TIBC, IRON, RETICCTPCT in the last 72 hours. Sepsis Labs:  Recent Labs Lab 08/03/16 0422  LATICACIDVEN 1.9    No results found for this or any previous visit (from the past 240 hour(s)).   Radiology Studies: No results found.  Scheduled Meds: . clopidogrel  75 mg Oral Daily  . diltiazem  120 mg Oral Daily  . DULoxetine  90 mg Oral Daily  . folic acid  1 mg Oral Daily  . metoprolol tartrate  50 mg Oral BID  . multivitamin with minerals  1 tablet Oral Daily  . thiamine  100 mg Oral Daily   Or  . thiamine  100 mg Intravenous Daily  . Warfarin - Pharmacist Dosing Inpatient   Does not apply q1800   Continuous Infusions: . sodium chloride Stopped (08/03/16 1500)  . heparin 1,200 Units/hr (08/06/16 1209)    LOS: 4 days   Time spent: 35 minutes.   Jerry Junkeryan Josslin Sanjuan, MD [facility] Pager [phone]   If 7PM-7AM, please contact night-coverage [email or web address] Password [email or web address] 08/06/2016, 7:31 PM

## 2016-08-06 NOTE — Progress Notes (Signed)
Progress Note  Patient Name: Jerry Baker Date of Encounter: 08/06/2016  Primary Cardiologist: Armanda Magic Wants to continue to f/u with [facility] on d/c  Subjective   No complaints, was unaware with torsades no chest pain and no SOB.  Inpatient Medications    Scheduled Meds: . clopidogrel  75 mg Oral Daily  . diltiazem  120 mg Oral Daily  . DULoxetine  90 mg Oral Daily  . folic acid  1 mg Oral Daily  . metoprolol tartrate  50 mg Oral BID  . multivitamin with minerals  1 tablet Oral Daily  . thiamine  100 mg Oral Daily   Or  . thiamine  100 mg Intravenous Daily  . Warfarin - Pharmacist Dosing Inpatient   Does not apply q1800   Continuous Infusions: . sodium chloride Stopped (08/03/16 1500)  . heparin 1,200 Units/hr (08/05/16 1426)   PRN Meds: acetaminophen **OR** acetaminophen, promethazine   Vital Signs    Vitals:   08/05/16 2055 08/05/16 2235 08/06/16 0451 08/06/16 0730  BP: 98/68 97/72 101/68 112/73  Pulse: 77 77 72 74  Resp:  (!) 23 17 16   Temp:  98.1 F (36.7 C) 98 F (36.7 C) 98.2 F (36.8 C)  TempSrc:  Oral  Axillary  SpO2:  94% 92% (!) 89%  Weight:   191 lb (86.6 kg)   Height:        Intake/Output Summary (Last 24 hours) at 08/06/16 0836 Last data filed at 08/06/16 0700  Gross per 24 hour  Intake             1484 ml  Output             5950 ml  Net            -4466 ml   Filed Weights   08/03/16 0613 08/05/16 0400 08/06/16 0451  Weight: 200 lb 12.8 oz (91.1 kg) 200 lb 6.4 oz (90.9 kg) 191 lb (86.6 kg)    Telemetry    SR with PVCs and episodes of torsades X 2 one 6 sec and one 10s ec approx.  Asymptomatic  - Personally Reviewed  ECG    No new tracings - Personally Reviewed  Physical Exam   GEN: No acute distress.   Neck: No JVD Cardiac: RRR, no murmurs, rubs, or gallops.  Respiratory:  Breath sounds present to auscultation bilaterally. + rhonchi  GI: Soft, nontender, non-distended  MS: No edema; No deformity. Neuro:  Nonfocal    Psych: Normal affect   Labs    Chemistry Recent Labs Lab 08/04/16 0231 08/04/16 0600 08/05/16 0324 08/05/16 1839 08/06/16 0359  NA 136  136  --  138  137  --  138  K 4.8  4.7  --  3.2*  3.4* 3.2* 3.4*  CL 103  103  --  100*  101  --  95*  CO2 21*  20*  --  26  21*  --  32  GLUCOSE 118*  119*  --  93  84  --  100*  BUN 70*  70*  --  69*  70*  --  60*  CREATININE 3.75*  3.86*  --  2.89*  2.84*  --  2.43*  CALCIUM 8.3*  8.4*  --  8.4*  8.4*  --  8.7*  PROT  --  6.3* 5.9*  --  6.1*  ALBUMIN 3.7 3.7 3.5  3.5  --  3.5  AST  --  709* 738*  --  483*  ALT  --  938* 1,327*  --  1,220*  ALKPHOS  --  89 84  --  89  BILITOT  --  2.0* 1.5*  --  1.7*  GFRNONAA 15*  14*  --  20*  21*  --  25*  GFRAA 17*  17*  --  24*  24*  --  29*  ANIONGAP 12  13  --  12  15  --  11     Hematology Recent Labs Lab 08/04/16 0231 08/05/16 0324 08/06/16 0359  WBC 11.8* 8.9 6.9  RBC 3.90* 3.83* 3.84*  HGB 12.3* 11.8* 12.2*  HCT 37.7* 36.6* 36.8*  MCV 96.7 95.6 95.8  MCH 31.5 30.8 31.8  MCHC 32.6 32.2 33.2  RDW 13.7 13.8 13.7  PLT 118* 87* 87*    Cardiac Enzymes Recent Labs Lab 08/02/16 2157 08/03/16 0422 08/03/16 0932 08/04/16 0323  TROPONINI 0.23* 0.23* 0.17* 0.19*   No results for input(s): TROPIPOC in the last 168 hours.   BNP Recent Labs Lab 08/03/16 0422  BNP 763.3*     DDimer  Recent Labs Lab 08/03/16 0727  DDIMER 3.62*     Radiology    US Abdomen Complete  Result Date: 08/04/2016 CLINICAL DATA:  Elevated liver function tests. EXAM: ABDOMEN ULTRASOUND COMPLETE COMPARISON:  CT abdomen and pelvis 08/03/2016. FINDINGS: Gallbladder: No gallstones or wall thickening visualized. Small amount of gallbladder sludge is identified. No sonographic Murphy sign noted by sonographer. Common bile duct: Diameter: Proximal common bile duct measures 0.4 cm. Distally, the duct measures 0.9 cm. Liver: No focal lesion identified. Within normal limits in parenchymal  echogenicity. IVC: No abnormality visualized. Pancreas: Visualized portion unremarkable. Spleen: Size and appearance within normal limits. Right Kidney: Length: 10.2 cm. Echogenicity within normal limits. 0.9 cm cyst upper pole noted. No hydronephrosis visualized. Left Kidney: Length: 10.3 cm. Echogenicity within normal limits. No mass or hydronephrosis visualized. Abdominal aorta: No aneurysm visualized. Other findings: Right pleural effusion is identified. IMPRESSION: Small amount of gallbladder sludge. Negative for stones or cholecystitis. Negative for intrahepatic biliary ductal dilatation. The distal common bile duct measures 0.9 cm but no stone or other evidence of obstruction is seen. Electronically Signed   By: Drusilla Kanner M.D.   On: 08/04/2016 12:26    Cardiac Studies   ECHO Study Conclusions  - Left ventricle: The cavity size was moderately dilated. Wall   thickness was normal. Systolic function was moderately to   severely reduced. The estimated ejection fraction was in the   range of 30% to 35%. Diffuse hypokinesis. Doppler parameters are   consistent with a reversible restrictive pattern, indicative of   decreased left ventricular diastolic compliance and/or increased   left atrial pressure (grade 3 diastolic dysfunction). - Aortic valve: Trileaflet; mildly thickened, mildly calcified   leaflets. There was trivial regurgitation. - Mitral valve: There was severe regurgitation. - Left atrium: The atrium was severely dilated. - Right ventricle: The cavity size was mildly dilated. Wall   thickness was normal. Pacer wire or catheter noted in right   ventricle. Systolic function was moderately reduced. - Pulmonary arteries: Systolic pressure was mildly increased. PA   peak pressure: 35 mm Hg (S).   Patient Profile     71 y.o. 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 Previous stent placement Problems started with contrast for right shoulder. Transferred with  dyspnea acute renal failure, troponin mildly elevated history of ETOH abuse with DT risk  Assessment & Plan  A fib --SR- maintaining   CM with EF 30-35% hx of ICD   torsades one episode of about 6 sec and one about 10 sec.  Asymptomatic, will interrogate AICD  CAD no chest pain with hx of MI  A/CRF- nephrology consulted  Cr 2.43 down from 3.75  ETOH DT prophylaxis  LFTs elevation AST / ALT decreasing   Hypokalemia today 3.4 goal is 4.0 magnesium 2.1 -given 60 meq today will recheck at noon.      Signed, Nada BoozerLaura Ingold, NP  08/06/2016, 8:36 AM    Patient examined chart reviewed. Clinically improving Telemetry with two episodes of torsades AICD noted and aborted RX just before broke Have asked EP to see Not a candidate for amiodarone with elevated LFTls  Not a candidate for acute cath for ischemic evaluation due to elevated Cr. ? Start Mexilitene Continue anticoagulation for PAF Suspect initial lexiscan is best way to risk stratify    Charlton HawsPeter Nishan

## 2016-08-06 NOTE — Consult Note (Signed)
ELECTROPHYSIOLOGY CONSULT NOTE    Patient ID: Jerry Baker MRN: [id number], DOB/AGE: [birth year] 71 y.o.  Admit date: 08/02/2016 Date of Consult: 08/06/2016   Primary Physician: System, Pcp Not In Primary Cardiologist: Dr. Eden Emms, [facility]  Reason for Consultation: VF  HPI: Jerry Baker is a 71 y.o. male who is being seen today for the evaluation of VF at the request of Nishan.  PMHx Includes CAD w/PCI in 2012, ICM w/ICD, , CRI, chronic CHF (systolic), there is mention of ETOH abuse, CRI,   The patient was transferred from [facility] to [facility] 08/03/16, there he presented with c/o increasing malaise, worsening SOB/DOE, this apparently started after having a CT of his shoulder (w/contrast) the week prior.  The morning of his ED visit he developed N/V and right sided abdominal discomfort, with worsening SOB, he was found in the ER to have Af w/RVR, elevated troponin and creatinine, he was transferred to [facility] 2/2 rapid AF and need for cardiology management.  He was started on cardizem gtt, coumadin with heparin gtt bridge, and has since converted to SR spontaneously,  nephrology was also brought on board with ARF, oncology for thrombocytopenia, and elevated LDH (this felt 2/2 myocardial injury).  5/9 evening he had increased WOB/SOB requiring BIPAP with c/o chest tightness, this improved with lasix, GI was consulted with acutely elevated Transaminases, though suspected to be secondary to shock liver/reactive hepatopathy, statin was held as well.  Yesterday he was observed on telemetry to have a short self limited episode of Tordases, electrolytes were checked and replacement ordered, QT was reported as normal.  He had a second similar episode and EP is being asked to evaluate further.  Device rep was called to evaluate his device.  At this time, no plans by cardiology for cath given renal issues, and are continuing to repleat his K+   [facility] 08/02/16 K+ 5.4 BUN/Creat 53/2.60Trop 0.15, Plts  106 LABS: 08/05/16: K+ 3.2-3.2 >> 3.4 today Mag 2.1 BUN/Creat 70/3.86 >>> 60/2.43 AST down to 483 ALT down slightly to 1220 WBC 6.9 H/H 12/36 plts 87 PT/INR 17.8/1.45 Trop I: 0.23, 0.23, 0.17, 0.19  DEVICE information: MDT single lead ICD, implante 11/23/12, followed by [facility] Interrogation today: Battery and lead measurements are OK May 7 device notes several NSVT episodes, longest 2 seconds, (these I suspect are his AF) 2 VR episodes 08/05/16, 16seconds, with an aborted shock   Past Medical History:  Diagnosis Date  . Cardiomyopathy (HCC)    EF 40% s/p dual chamber AICD  . CHF (congestive heart failure) (HCC)   . CKD (chronic kidney disease)    told by his PCP about 8 months ago and was seen by a nephrologist and his ACE I was stopped.    . Coronary artery disease 2012   s/p MI 2012 with PCI and 2 stents  . Diverticulitis   . Peripheral neuropathy      Surgical History:  Past Surgical History:  Procedure Laterality Date  . APPENDECTOMY    . CARDIAC CATHETERIZATION    . CORONARY ANGIOPLASTY    . ELBOW SURGERY Left   . FINGER GANGLION CYST EXCISION    . INSERT / REPLACE / REMOVE PACEMAKER  2014   dual chamber AICD  . PARTIAL COLECTOMY    . ROTATOR CUFF REPAIR Left   . urinary bladder surgery       Prescriptions Prior to Admission  Medication Sig Dispense Refill Last Dose  . ASPERCREME LIDOCAINE EX Apply 1 application topically  as needed (for pain).   08/02/2016 at Unknown time  . aspirin EC 81 MG tablet Take 162 mg by mouth daily.    08/02/2016 at Unknown time  . clopidogrel (PLAVIX) 75 MG tablet Take 75 mg by mouth daily.   08/02/2016 at 0900  . Cyanocobalamin (VITAMIN B 12 PO) Take 100 mcg by mouth daily.   08/02/2016 at Unknown time  . DULoxetine (CYMBALTA) 30 MG capsule Take 90 mg by mouth daily.    08/02/2016 at Unknown time    Inpatient Medications:  . clopidogrel  75 mg Oral Daily  . diltiazem  120 mg Oral Daily  . DULoxetine  90 mg Oral Daily  . folic acid   1 mg Oral Daily  . metoprolol tartrate  50 mg Oral BID  . multivitamin with minerals  1 tablet Oral Daily  . thiamine  100 mg Oral Daily   Or  . thiamine  100 mg Intravenous Daily  . Warfarin - Pharmacist Dosing Inpatient   Does not apply q1800    Allergies:  Allergies  Allergen Reactions  . Penicillins Other (See Comments)    Unknown    Social History   Social History  . Marital status: Married    Spouse name: N/A  . Number of children: N/A  . Years of education: N/A   Occupational History  . Not on file.   Social History Main Topics  . Smoking status: Former Smoker    Quit date: 08/03/2010  . Smokeless tobacco: Never Used  . Alcohol use 4.2 oz/week    7 Cans of beer per week  . Drug use: No  . Sexual activity: Not on file   Other Topics Concern  . Not on file   Social History Narrative  . No narrative on file     Family History  Problem Relation Age of Onset  . Dementia Mother   . Breast cancer Mother   . Cancer Father   . Parkinson's disease Sister      Review of Systems: All other systems reviewed and are otherwise negative except as noted above.  Physical Exam: Vitals:   08/05/16 2055 08/05/16 2235 08/06/16 0451 08/06/16 0730  BP: 98/68 97/72 101/68 112/73  Pulse: 77 77 72 74  Resp:  (!) 23 17 16   Temp:  98.1 F (36.7 C) 98 F (36.7 C) 98.2 F (36.8 C)  TempSrc:  Oral  Axillary  SpO2:  94% 92% (!) 89%  Weight:   191 lb (86.6 kg)   Height:        GEN- The patient is well appearing, alert and oriented x 3 today.   HEENT: normocephalic, atraumatic; sclera clear, conjunctiva pink; hearing intact; oropharynx clear; neck supple, no JVP Lymph- no cervical lymphadenopathy Lungs-  CTA b/l, normal work of breathing.  No wheezes, rales, rhonchi Heart- RRR, 1/6 SM, rubs or gallops, PMI not laterally displaced GI- soft, non-tender, non-distended Extremities- no clubbing, cyanosis, no edema MS- no significant deformity or atrophy Skin- warm and dry,  no rash or lesion Psych- euthymic mood, full affect Neuro- no gross deficits observed  Labs:   Lab Results  Component Value Date   WBC 6.9 08/06/2016   HGB 12.2 (L) 08/06/2016   HCT 36.8 (L) 08/06/2016   MCV 95.8 08/06/2016   PLT 87 (L) 08/06/2016    Recent Labs Lab 08/06/16 0359  NA 138  K 3.4*  CL 95*  CO2 32  BUN 60*  CREATININE 2.43*  CALCIUM 8.7*  PROT 6.1*  BILITOT 1.7*  ALKPHOS 89  ALT 1,220*  AST 483*  GLUCOSE 100*      Radiology/Studies:  [place] Abdomen Complete Result Date: 08/04/2016 CLINICAL DATA:  Elevated liver function tests. EXAM: ABDOMEN ULTRASOUND COMPLETE COMPARISON:  CT abdomen and pelvis 08/03/2016. FINDINGS: Gallbladder: No gallstones or wall thickening visualized. Small amount of gallbladder sludge is identified. No sonographic Murphy sign noted by sonographer. Common bile duct: Diameter: Proximal common bile duct measures 0.4 cm. Distally, the duct measures 0.9 cm. Liver: No focal lesion identified. Within normal limits in parenchymal echogenicity. IVC: No abnormality visualized. Pancreas: Visualized portion unremarkable. Spleen: Size and appearance within normal limits. Right Kidney: Length: 10.2 cm. Echogenicity within normal limits. 0.9 cm cyst upper pole noted. No hydronephrosis visualized. Left Kidney: Length: 10.3 cm. Echogenicity within normal limits. No mass or hydronephrosis visualized. Abdominal aorta: No aneurysm visualized. Other findings: Right pleural effusion is identified. IMPRESSION: Small amount of gallbladder sludge. Negative for stones or cholecystitis. Negative for intrahepatic biliary ductal dilatation. The distal common bile duct measures 0.9 cm but no stone or other evidence of obstruction is seen. Electronically Signed   By: Drusilla Kannerhomas  Dalessio M.D.   On: 08/04/2016 12:26   Dg Chest Port 1 View Result Date: 08/04/2016 CLINICAL DATA:  Respiratory crackles in both lungs. Increasing shortness of breath tonight. EXAM: PORTABLE CHEST 1 VIEW  COMPARISON:  Radiograph yesterday. FINDINGS: Single lead left-sided pacemaker in place. Cardiomegaly is unchanged. Increasing hazy opacity at the right lung base may be pleural effusion or airspace disease. Increasing pulmonary edema in Kerley B-lines. No pneumothorax. Stable osseous structures. IMPRESSION: Increasing pulmonary edema. Progressive hazy opacity at the right lung base may be increased pleural fluid or airspace disease, atelectasis or pneumonia. Electronically Signed   By: Rubye OaksMelanie  Ehinger M.D.   On: 08/04/2016 01:36    Ct Renal Stone Study Result Date: 08/03/2016 CLINICAL DATA:  71 y/o M; right flank pain. History of chronic kidney disease, appendectomy, and partial colectomy. EXAM: CT ABDOMEN AND PELVIS WITHOUT CONTRAST TECHNIQUE: Multidetector CT imaging of the abdomen and pelvis was performed following the standard protocol without IV contrast. COMPARISON:  None. FINDINGS: Lower chest: AICD lead in the right ventricle. Severe coronary artery calcification. Normal heart size. No pericardial effusion. Small right greater than left pleural effusions. Hepatobiliary: No focal liver abnormality is seen. No gallstones, gallbladder wall thickening, or biliary dilatation. Pancreas: There is indistinctness of fat surrounding the pancreatic and retroperitoneal edema greater on the right. Spleen: Normal in size without focal abnormality. Adrenals/Urinary Tract: Adrenal glands are unremarkable. Kidneys are normal, without renal calculi, focal lesion, or hydronephrosis. Bladder is unremarkable. Stomach/Bowel: Stomach is within normal limits. Appendix appears normal. No evidence of bowel wall thickening, distention, or inflammatory changes. Few sigmoid diverticular present. Colorectal anastomosis is patent without obstruction. Vascular/Lymphatic: Aortic atherosclerosis. No enlarged abdominal or pelvic lymph nodes. Reproductive: Prostate is unremarkable. Other: No abdominal wall hernia or abnormality. No  abdominopelvic ascites. Musculoskeletal: Moderate spondylosis of the visible thoracolumbar spine. No acute osseous abnormality is evident. IMPRESSION: 1. Indistinctness of pancreatic head and mild nonspecific retroperitoneal edema may represent acute pancreatitis. Correlation with lipase recommended. 2. No hydronephrosis or urinary stone disease. 3. Mild sigmoid diverticulosis. No obstructive or inflammatory changes of bowel identified. 4. Small bilateral pleural effusions. Electronically Signed   By: Mitzi HansenLance  Furusawa-Stratton M.D.   On: 08/03/2016 03:02    Reviewed by myself: EKG: #1 AFib, 89bpm, QRS 118ms, QTc44879ms #2 SR 90bpm, PR 154ms, QRS 120ms, QTc 486ms #3 (08/04/16)SR  79bpm, PR , QRS , QT measured by myself , claculated ATc 482 (machine 504) #4 yesterday SB 56bpm, PVC's, PR , QRS QTc 465 ( measure , corrected ) TELEMETRY: SR, at times he has frequent PVC's and PACs, infrequent couplet, NSVT longest 5 beats, he has had 2 PMVT episodes, 6.4 seconds and 13.18 seconds, both self terminated   08/05/16: TTE Study Conclusions - Left ventricle: The cavity size was moderately dilated. Wall   thickness was normal. Systolic function was moderately to   severely reduced. The estimated ejection fraction was in the   range of 30% to 35%. Diffuse hypokinesis. Doppler parameters are   consistent with a reversible restrictive pattern, indicative of   decreased left ventricular diastolic compliance and/or increased   left atrial pressure (grade 3 diastolic dysfunction). - Aortic valve: Trileaflet; mildly thickened, mildly calcified   leaflets. There was trivial regurgitation. - Mitral valve: There was severe regurgitation. - Left atrium: The atrium was severely dilated. (46mm) - Right ventricle: The cavity size was mildly dilated. Wall   thickness was normal. Pacer wire or catheter noted in right   ventricle. Systolic function was moderately reduced. - Pulmonary  arteries: Systolic pressure was mildly increased. PA   peak pressure: 35 mm Hg (S).  [facility]: Cath/STMI BMS x2 to RCA and noted CTO of LAD 11/23/12 ICD implanted 07/15/2015: TTE, EF 41%, LVEF 41%, severely dilated LV, mod conc LVH, + WMA, grade I-DD, mod MR,trace AI/TR Note from 06/08/16 reports device check w/eight 2-3 second NSVT episodes, normal function  Assessment and Plan:   1. PMVT     ICD function is intact     Keep K+ >/= 4.0, Mag >/= 2.0     On lopressor 50mg  BID     Given quite elevated LFTs, hesitate to use amiodarone (improving), I will review with Dr. Ladona Ridgel, AAD options, likely Mexiletine if more      Ischemic evaluation +/- cath when able      No hx of shocks or syncope historically  2. PAFib (new)     CHA2DS2Vasc is at least 3, started on warfarin/heparin gtt     Severe MR and dilated LA will make rhythm control difficult going forward     In SR currently     On PO diltiazem/BB  3. Presumed ICM, CHF exacerbation     EF 30-35%, apparently historically 40's by notes     Cumulatively negative -  4. CAD, c/o chest tightness felt to be more respiratory by notes     Mildly abnormal troponins/flat in setting of ARF     No plans for cath or ischemic w/u at this time given renal/medical issues     Patient has not had any kind of anginal complaints, up until this week has not had any kind of exertional intolerances, was golfing regularly.  5. Markedly abn LFT's     GI on case     Suspect shock liver or 2/2 CHF, hx of ETOH  6. ARF     Nephrology on board      Signed, Maryella Shivers 08/06/2016 8:53 AM  EP Attending  Patient seen and examined. Agree with the findings as noted above. The patient is a 71 year old man with an ischemic cardiomyopathy, chronic systolic heart failure, status post ICD implantation who presented with atrial fibrillation as well as shortness of breath. His prior presentation with unstable angina was notable for hypotension  but no chest pressure. He has had no additional  chest pressure. He did develop atrial fibrillation and shortness of breath with his current set of symptoms. He was found to have polymorphic ventricular tachycardia which terminated spontaneously just prior to ICD discharge. The patient denies any recent ICD shocks. He denies anginal symptoms. His physical exam is notable for him being a 71 year old man with no acute distress his blood pressure 100/72, pulse was 74 and regular respirations were 18 cardiovascular exam is regular rate rhythm with normal S1. There were no gallops. Lungs clear bilaterally. Abdominal exam was soft nontender extremities demonstrated trace peripheral edema. Labs been reviewed as has his chest x-ray. Interrogation of his ICD has been reviewed. His episodes of ventricular tachycardia terminated just prior to ICD shock. He was asymptomatic.  Assessment and plan 1. Polymorphic ventricular tachycardia - the patient was asymptomatic. Unfortunately patients with this condition usually have underlying occult ischemia. He is not a particularly good candidate for heart catheterization. Note Dr. Jamse Mead recommendation for stress testing. I would not recommend initiating antiarrhythmic drug at this time and reserve mexiletine or potentially sotalol for symptomatic VT or ICD shocks. 2. Coronary artery disease - he has never had angina. I'm concerned about occlusion of his coronary stent. Because he is not a good candidate for cardiac catheterization with his renal dysfunction and other comorbidities, note plans for stress testing. 3. New onset atrial fibrillation - the patient will need systemic anticoagulation as he is at increased risk for stroke. Fortunately he is reverted to sinus rhythm.  Lewayne Bunting M.D.

## 2016-08-06 NOTE — Care Management Important Message (Signed)
Important Message  Patient Details  Name: Jerry Baker MRN: [id number] Date of Birth: 02/25/1946   Medicare Important Message Given:  Yes    Kyla BalzarineShealy, Navia Lindahl Abena 08/06/2016, 1:14 PM

## 2016-08-06 NOTE — Progress Notes (Signed)
ANTICOAGULATION CONSULT NOTE - follow up Pharmacy Consult for Heparin and Coumadn Indication: atrial fibrillation  Allergies  Allergen Reactions  . Penicillins Other (See Comments)    Unknown    Patient Measurements: Height: 5\' 11"  (180.3 cm) Weight: 191 lb (86.6 kg) IBW/kg (Calculated) : 75.3 kg   Vital Signs: Temp: 97.9 F (36.6 C) (05/11 1122) Temp Source: Oral (05/11 1122) BP: 109/65 (05/11 1122) Pulse Rate: 70 (05/11 1122)  Labs:  Recent Labs  08/04/16 0231 08/04/16 0323 08/05/16 0324 08/06/16 0359  HGB 12.3*  --  11.8* 12.2*  HCT 37.7*  --  36.6* 36.8*  PLT 118*  --  87* 87*  LABPROT 21.0*  --  18.8* 17.8*  INR 1.78  --  1.56 1.45  HEPARINUNFRC 0.45  --  0.33 0.32  CREATININE 3.75*  3.86*  --  2.89*  2.84* 2.43*  TROPONINI  --  0.19*  --   --     Estimated Creatinine Clearance: 29.7 mL/min (A) (by C-G formula based on SCr of 2.43 mg/dL (H)).   Medical History: Past Medical History:  Diagnosis Date  . Cardiomyopathy (HCC)    EF 40% s/p dual chamber AICD  . CHF (congestive heart failure) (HCC)   . CKD (chronic kidney disease)    told by his PCP about 8 months ago and was seen by a nephrologist and his ACE I was stopped.    . Coronary artery disease 2012   s/p MI 2012 with PCI and 2 stents  . Diverticulitis   . Peripheral neuropathy      Assessment: 71 [facility] with new onset Afib RVR and AKI. Pharmacy  consulted on to manage heparin bridge to coumadin.  No oral anticoagulation PTA.  Baseline INR= 1.59, Hgb 13.1, PLTC 106K. --Hgb low stable, PLTC low/stable 108>95>118>87>87k.  Heparin level is 0.32, remains therapeutic on heparin drip 1200 units/hr.  Baseline INR=1.59, INR initially increased to 1.79 after 1st dose of coumadin 5mg  and LFTs were increased 59/88-->709/938. Now INR decreased to 1.45. No bleeding reported. Hgb low stable with Hgb decrease to 11.8 today. PLTC low/stable [phone]>95>118>87k Elevated  LFTs: AST/ALT  59/88-->709/938 >738/1327>483/1220 Total bili: 1.8>2.0>1.5>1.7 AKI: Scr 3.35>2.89>2.43   Goal of Therapy:  INR 2-3 Heparin level 0.3-0.7 units/ml Monitor platelets by anticoagulation protocol: Yes   Plan:  Continue Heparin infusion 1200 units/hr  Coumadin 6 mg po x1  (cautious with increasing dose due to elevated LFTs, baseline INR >1.4) Daily HL, INR and CBC. Educate patient about coumadin. Book/video ordered previously.  Thank you for allowing pharmacy to be part of this patients care team. Noah Delaineuth Keltie Labell, RPh Clinical Pharmacist Pager: [phone] Main Pharmacy [phone] 08/06/2016,11:28 AM

## 2016-08-06 NOTE — Plan of Care (Signed)
Problem: Education: Goal: Knowledge of [facility] Education information/materials will improve Outcome: Progressing Patient aware of plan of care.  RN provided medication education on all medications administered this shift.  RN also provided medication education on Coumadin with patient.  Patient stated understanding.

## 2016-08-06 NOTE — Progress Notes (Signed)
Patient potassium resulted at 3.4 this morning.  Cardiology paged order received and entered per RN per Dr. Jacques EarthlyFudims instruction (on call for Cardiology).

## 2016-08-06 NOTE — Progress Notes (Signed)
S: Feels well, no new CO O:BP 101/68   Pulse 72   Temp 98 F (36.7 C)   Resp 17   Ht 5\' 11"  (1.803 m)   Wt 86.6 kg (191 lb)   SpO2 92%   BMI 26.64 kg/m   Intake/Output Summary (Last 24 hours) at 08/06/16 0719 Last data filed at 08/06/16 0455  Gross per 24 hour  Intake              960 ml  Output             5950 ml  Net            -4990 ml   Weight change: -4.264 kg (-9 lb 6.4 oz) ZOX:WRUEAGen:awake and alert CVS: RRR w PVC's Resp: faint basilar crackles Abd:+ BS NTND Ext: No edema NEURO:CNI Ox3 no asterixis   . clopidogrel  75 mg Oral Daily  . diltiazem  120 mg Oral Daily  . DULoxetine  90 mg Oral Daily  . folic acid  1 mg Oral Daily  . furosemide  80 mg Oral BID  . metoprolol tartrate  50 mg Oral BID  . multivitamin with minerals  1 tablet Oral Daily  . thiamine  100 mg Oral Daily   Or  . thiamine  100 mg Intravenous Daily  . Warfarin - Pharmacist Dosing Inpatient   Does not apply q1800   [place] Abdomen Complete  Result Date: 08/04/2016 CLINICAL DATA:  Elevated liver function tests. EXAM: ABDOMEN ULTRASOUND COMPLETE COMPARISON:  CT abdomen and pelvis 08/03/2016. FINDINGS: Gallbladder: No gallstones or wall thickening visualized. Small amount of gallbladder sludge is identified. No sonographic Murphy sign noted by sonographer. Common bile duct: Diameter: Proximal common bile duct measures 0.4 cm. Distally, the duct measures 0.9 cm. Liver: No focal lesion identified. Within normal limits in parenchymal echogenicity. IVC: No abnormality visualized. Pancreas: Visualized portion unremarkable. Spleen: Size and appearance within normal limits. Right Kidney: Length: 10.2 cm. Echogenicity within normal limits. 0.9 cm cyst upper pole noted. No hydronephrosis visualized. Left Kidney: Length: 10.3 cm. Echogenicity within normal limits. No mass or hydronephrosis visualized. Abdominal aorta: No aneurysm visualized. Other findings: Right pleural effusion is identified. IMPRESSION: Small amount of  gallbladder sludge. Negative for stones or cholecystitis. Negative for intrahepatic biliary ductal dilatation. The distal common bile duct measures 0.9 cm but no stone or other evidence of obstruction is seen. Electronically Signed   By: Drusilla Kannerhomas  Dalessio M.D.   On: 08/04/2016 12:26   BMET    Component Value Date/Time   NA 138 08/06/2016 0359   K 3.4 (L) 08/06/2016 0359   CL 95 (L) 08/06/2016 0359   CO2 32 08/06/2016 0359   GLUCOSE 100 (H) 08/06/2016 0359   BUN 60 (H) 08/06/2016 0359   CREATININE 2.43 (H) 08/06/2016 0359   CALCIUM 8.7 (L) 08/06/2016 0359   GFRNONAA 25 (L) 08/06/2016 0359   GFRAA 29 (L) 08/06/2016 0359   CBC    Component Value Date/Time   WBC 6.9 08/06/2016 0359   RBC 3.84 (L) 08/06/2016 0359   HGB 12.2 (L) 08/06/2016 0359   HCT 36.8 (L) 08/06/2016 0359   PLT 87 (L) 08/06/2016 0359   MCV 95.8 08/06/2016 0359   MCH 31.8 08/06/2016 0359   MCHC 33.2 08/06/2016 0359   RDW 13.7 08/06/2016 0359   LYMPHSABS 2.2 08/02/2016 2157   MONOABS 0.7 08/02/2016 2157   EOSABS 0.0 08/02/2016 2157   BASOSABS 0.0 08/02/2016 2157     Assessment: 1. Acute  Renal failure  The CT [facility] had at the [facility] was a CT arthrogram of shoulder so no IV contrast was used.  Scr cont to improve improve  Suspect the cause of ARF is ATN from episodic hypotension in face of A fib 2. A fib, now NSR.  On heparin and coumadin 3. Thrombocytopenia 4. Hx CAD 5. Pulm edema 6. hypokalemia Plan: 1. Hold lasix today and resume as needed. 2. Anticipate renal fx to cont to improve 3. Replace K as you are doing 4. Daily labs  Jenipher Havel T

## 2016-08-07 LAB — COMPREHENSIVE METABOLIC PANEL
ALK PHOS: 84 U/L (ref 38–126)
ALT: 933 U/L — AB (ref 17–63)
AST: 261 U/L — AB (ref 15–41)
Albumin: 3.2 g/dL — ABNORMAL LOW (ref 3.5–5.0)
Anion gap: 10 (ref 5–15)
BUN: 46 mg/dL — AB (ref 6–20)
CHLORIDE: 99 mmol/L — AB (ref 101–111)
CO2: 32 mmol/L (ref 22–32)
CREATININE: 1.88 mg/dL — AB (ref 0.61–1.24)
Calcium: 9.1 mg/dL (ref 8.9–10.3)
GFR calc Af Amer: 40 mL/min — ABNORMAL LOW (ref >60)
GFR, EST NON AFRICAN AMERICAN: 34 mL/min — AB (ref >60)
Glucose, Bld: 107 mg/dL — ABNORMAL HIGH (ref 65–99)
Potassium: 4.2 mmol/L (ref 3.5–5.1)
Sodium: 141 mmol/L (ref 135–145)
Total Bilirubin: 1.4 mg/dL — ABNORMAL HIGH (ref 0.3–1.2)
Total Protein: 6 g/dL — ABNORMAL LOW (ref 6.5–8.1)

## 2016-08-07 LAB — CBC
HEMATOCRIT: 38.7 % — AB (ref 39.0–52.0)
Hemoglobin: 12.3 g/dL — ABNORMAL LOW (ref 13.0–17.0)
MCH: 31 pg (ref 26.0–34.0)
MCHC: 31.8 g/dL (ref 30.0–36.0)
MCV: 97.5 fL (ref 78.0–100.0)
Platelets: 89 10*3/uL — ABNORMAL LOW (ref 150–400)
RBC: 3.97 MIL/uL — ABNORMAL LOW (ref 4.22–5.81)
RDW: 14 % (ref 11.5–15.5)
WBC: 8.2 10*3/uL (ref 4.0–10.5)

## 2016-08-07 LAB — PROTIME-INR
INR: 1.55
Prothrombin Time: 18.7 seconds — ABNORMAL HIGH (ref 11.4–15.2)

## 2016-08-07 LAB — HEPARIN LEVEL (UNFRACTIONATED)
HEPARIN UNFRACTIONATED: 0.24 [IU]/mL — AB (ref 0.30–0.70)
HEPARIN UNFRACTIONATED: 0.43 [IU]/mL (ref 0.30–0.70)

## 2016-08-07 LAB — MRSA PCR SCREENING: MRSA by PCR: NEGATIVE

## 2016-08-07 MED ORDER — WARFARIN SODIUM 5 MG PO TABS
5.0000 mg | ORAL_TABLET | Freq: Once | ORAL | Status: AC
  Administered 2016-08-07: 5 mg via ORAL
  Filled 2016-08-07: qty 1

## 2016-08-07 NOTE — Progress Notes (Signed)
Subjective:  No complaints of chest pain or shortness of breath.  Renal and EP notes were read and appreciated.  He is feeling better at the present time without nausea.  Renal function noted to be improving.  Objective:  Vital Signs in the last 24 hours: BP 103/62 (BP Location: Left Arm)   Pulse 76   Temp 98.8 F (37.1 C) (Oral)   Resp 14   Ht 5\' 11"  (1.803 m)   Wt 86.6 kg (190 lb 14.4 oz)   SpO2 94%   BMI 26.63 kg/m   Physical Exam: Pleasant mildly obese white male in no acute distress Lungs:  Clear, healed ICD incision in the left subclavicular area Cardiac:  Regular rhythm, normal S1 and S2, no S3 Extremities:  No edema present  Intake/Output from previous day: 05/11 0701 - 05/12 0700 In: 1246.4 [P.O.:960; I.V.:286.4] Out: 1125 [Urine:1125]  Weight Filed Weights   08/05/16 0400 08/06/16 0451 08/07/16 0446  Weight: 90.9 kg (200 lb 6.4 oz) 86.6 kg (191 lb) 86.6 kg (190 lb 14.4 oz)    Lab Results: Basic Metabolic Panel:  Recent Labs  30/86/5704/02/14 1455 08/07/16 0500  NA 138 141  K 3.9 4.2  CL 98* 99*  CO2 30 32  GLUCOSE 153* 107*  BUN 55* 46*  CREATININE 2.12* 1.88*   CBC:  Recent Labs  08/06/16 0359 08/07/16 0500  WBC 6.9 8.2  HGB 12.2* 12.3*  HCT 36.8* 38.7*  MCV 95.8 97.5  PLT 87* 89*   Cardiac Enzymes:  Telemetry: Sinus rhythm, occasional PVCs and occasional couplets, no sustained torsade noted  Assessment/Plan:  1.  Ischemic cardiomyopathy 2.  Torsade deployments previously seen 3.  Acute renal failure approaching normal 4.  Elevation of liver enzymes proving  Recommendations:  Dr. Lubertha Basqueaylor's note and recommendations noted.  Renal function appears to be improving.  We'll reevaluate renal function tomorrow and determine if he would be a candidate for stress testing as per previous notes or whether he would want to maybe consider catheterization after a delayed period of time to evaluate recovery from renal function and evaluation of recovery of  liver tests.     Darden PalmerW. Spencer Kaylan Yates, Jr.  MD [facility]  08/07/2016, 11:54 AM

## 2016-08-07 NOTE — Progress Notes (Signed)
PROGRESS NOTE    Jerry Baker  ZOX:[id number] DOB: Aug 24, 1945 DOA: 08/02/2016 PCP: System, Pcp Not In    Brief Narrative: Jerry Baker a 71 y.o.malewith history of CAD status post stenting, cardiomyopathy last EF measured was 40% with AICD and pacemaker placement, peripheral neuropathy presented to the ER at [facility] with complaints of shortness of breath. He was found to be in afib with RVR, acute renal failure and in acute pancreatitis.   Assessment & Plan:   Principal Problem:   Atrial fibrillation with RVR (HCC) Active Problems:   Cardiomyopathy (HCC)   Diverticulitis   Peripheral neuropathy   Coronary artery disease involving native coronary artery of native heart without angina pectoris   AKI (acute kidney injury) (HCC)   Elevated troponin  Atrial fibrillation with RVR and episodes of Torsades on 5/10: Maintaining NSR currently. AICD interrogation showed nonsustained polymorphic VTach which spontaneously terminated < 1 sec prior to shock from AICD.  - EP consulted - Keep K > 4 and Mg > 2 - Continue telemetry - Heparin bridge to coumadin started 5/8 - Continue cardizem  Cardiomyopathy and acute on chronic systolic CHF: EF 81-19% - Per cardiology. Considering stress testing vs. cath pending creatinine in AM. Troponin elevation thought to be due to demand ischemia.  - Holding lasix since 5/11.   Acute kidney Injury: Unclear etiology, possibly pre renal, vs contrast induced. Improved with diuresis.  - Hold lasix and monitor per nephrology recommendation.   - Avoid nephrotoxins  Alcohol abuse: CIWA scores have consistently remained zero.  - DC'ed CIWA.  - Continue MVM's   Elevated liver function tests: US abdomen shows some gall bladder sludge and CBD at 0.9 cm, no stones or evidence of obstruction. - GI consulted, suspect due to shock liver/ischemic hepatopathy, since improving. Precipitous improvement not consistent with statin-induced hepatopathy - Will  need close follow up as outpatient - Monitor, improving.  Acute mild pancreatitis: Resolved, lipase levels wnl. No abd pain.  - Tolerating diet, resolved.   Mild anemia: AOCD, mild, stable. Elevated LDH likely not indicative of hemolysis.  - Monitor  Acute thrombocytopenia: Heparin and antiplatelets still ok if plt >50k per oncology.  - Monitor. Dr. Bertis Ruddy has evaluated patient.   DVT prophylaxis: Heparin gtt, coumadin.  Code Status: Full Family Communication: None at bedside Disposition Plan: Pending heparin bridge to coumadin. Will get PT consult 5/13  Consultants:   Cardiology  Nephrology  Gastroenterology, Dr. Matthias Hughs, Dr. Levora Angel.   Procedures:  Echocardiogram 08/05/2016: - Left ventricle: The cavity size was moderately dilated. Wall   thickness was normal. Systolic function was moderately to   severely reduced. The estimated ejection fraction was in the   range of 30% to 35%. Diffuse hypokinesis. Doppler parameters are   consistent with a reversible restrictive pattern, indicative of   decreased left ventricular diastolic compliance and/or increased   left atrial pressure (grade 3 diastolic dysfunction). - Aortic valve: Trileaflet; mildly thickened, mildly calcified   leaflets. There was trivial regurgitation. - Mitral valve: There was severe regurgitation. - Left atrium: The atrium was severely dilated. - Right ventricle: The cavity size was mildly dilated. Wall   thickness was normal. Pacer wire or catheter noted in right   ventricle. Systolic function was moderately reduced. - Pulmonary arteries: Systolic pressure was mildly increased. PA   peak pressure: 35 mm Hg (S).  U/S abdomen 08/04/2016: Small amount of gallbladder sludge. Negative for stones or cholecystitis. Negative for intrahepatic biliary ductal dilatation. The distal common bile duct  measures 0.9 cm but no stone or other evidence of obstruction is seen.   Antimicrobials: none    Subjective: Denies dyspnea.   Objective: Vitals:   08/06/16 2010 08/07/16 0049 08/07/16 0446 08/07/16 0947  BP: 111/69 105/62 115/66 103/62  Pulse: 76 74 76   Resp: 20 20 20 14   Temp: 98.4 F (36.9 C) 98.2 F (36.8 C) 98 F (36.7 C) 98.8 F (37.1 C)  TempSrc: Oral Oral Oral Oral  SpO2: 97% 98% 97% 94%  Weight:   86.6 kg (190 lb 14.4 oz)   Height:        Intake/Output Summary (Last 24 hours) at 08/07/16 1806 Last data filed at 08/07/16 1000  Gross per 24 hour  Intake           1522.9 ml  Output              275 ml  Net           1247.9 ml   Filed Weights   08/05/16 0400 08/06/16 0451 08/07/16 0446  Weight: 90.9 kg (200 lb 6.4 oz) 86.6 kg (191 lb) 86.6 kg (190 lb 14.4 oz)   Examination: General exam: 70yo M in no distress Respiratory system: Clear to auscultation. Respiratory effort normal. Cardiovascular system: S1 & S2 heard, RRR. No JVD, murmurs, rubs, gallops or clicks.  Gastrointestinal system: Abdomen is soft, nontender and nondistended. No organomegaly or masses felt. Normal bowel sounds heard. Central nervous system: Alert and oriented. No focal neurological deficits. Extremities: No edema or deformities. Skin: No rashes, lesions or ulcers. Nontender diffuse ecchymosis on left volar forearm. Psychiatry: Judgement and insight appear normal. Euthymic mood, congruent affect.    Data Reviewed: I have personally reviewed following labs and imaging studies  CBC:  Recent Labs Lab 08/02/16 2157 08/03/16 0422 08/03/16 0727 08/04/16 0231 08/05/16 0324 08/06/16 0359 08/07/16 0500  WBC 10.4 8.4  --  11.8* 8.9 6.9 8.2  NEUTROABS 7.4  --   --   --   --   --   --   HGB 12.9* 12.4*  --  12.3* 11.8* 12.2* 12.3*  HCT 38.8* 37.8*  --  37.7* 36.6* 36.8* 38.7*  MCV 95.8 95.2  --  96.7 95.6 95.8 97.5  PLT 108* 101* 95* 118* 87* 87* 89*   Basic Metabolic Panel:  Recent Labs Lab 08/03/16 0422 08/04/16 0231 08/05/16 0324 08/05/16 1839 08/06/16 0359 08/06/16 1455  08/07/16 0500  NA 137 136  136 138  137  --  138 138 141  K 5.5* 4.8  4.7 3.2*  3.4* 3.2* 3.4* 3.9 4.2  CL 106 103  103 100*  101  --  95* 98* 99*  CO2 20* 21*  20* 26  21*  --  32 30 32  GLUCOSE 153* 118*  119* 93  84  --  100* 153* 107*  BUN 61* 70*  70* 69*  70*  --  60* 55* 46*  CREATININE 3.35* 3.75*  3.86* 2.89*  2.84*  --  2.43* 2.12* 1.88*  CALCIUM 8.7* 8.3*  8.4* 8.4*  8.4*  --  8.7* 9.0 9.1  MG 2.4  --   --  2.1  --   --   --   PHOS  --  7.3* 5.1*  --   --   --   --    GFR: Estimated Creatinine Clearance: 38.4 mL/min (A) (by C-G formula based on SCr of 1.88 mg/dL (H)). Liver Function Tests:  Recent Labs  Lab 08/02/16 2157 08/04/16 0231 08/04/16 0600 08/05/16 0324 08/06/16 0359 08/07/16 0500  AST 59*  --  709* 738* 483* 261*  ALT 88*  --  938* 1,327* 1,220* 933*  ALKPHOS 79  --  89 84 89 84  BILITOT 1.8*  --  2.0* 1.5* 1.7* 1.4*  PROT 6.8  --  6.3* 5.9* 6.1* 6.0*  ALBUMIN 4.0 3.7 3.7 3.5  3.5 3.5 3.2*    Recent Labs Lab 08/03/16 0539  LIPASE 21   Coagulation Profile:  Recent Labs Lab 08/03/16 0727 08/04/16 0231 08/05/16 0324 08/06/16 0359 08/07/16 0500  INR 1.59 1.78 1.56 1.45 1.55   Cardiac Enzymes:  Recent Labs Lab 08/02/16 2157 08/03/16 0422 08/03/16 0932 08/04/16 0323  TROPONINI 0.23* 0.23* 0.17* 0.19*   Sepsis Labs:  Recent Labs Lab 08/03/16 0422  LATICACIDVEN 1.9    No results found for this or any previous visit (from the past 240 hour(s)).   Radiology Studies: No results found.  Scheduled Meds: . clopidogrel  75 mg Oral Daily  . diltiazem  120 mg Oral Daily  . DULoxetine  90 mg Oral Daily  . folic acid  1 mg Oral Daily  . metoprolol tartrate  50 mg Oral BID  . multivitamin with minerals  1 tablet Oral Daily  . thiamine  100 mg Oral Daily  . warfarin  5 mg Oral ONCE-1800  . Warfarin - Pharmacist Dosing Inpatient   Does not apply q1800   Continuous Infusions: . heparin 1,350 Units/hr (08/07/16 0940)     LOS: 5 days   Time spent: 25 minutes.   Hazeline Junkeryan Lizmary Nader, MD [facility] Pager [phone]   If 7PM-7AM, please contact night-coverage [email or web address] Password [email or web address] 08/07/2016, 6:06 PM

## 2016-08-07 NOTE — Plan of Care (Signed)
Problem: Activity: Goal: Risk for activity intolerance will decrease Outcome: Progressing Patient ambulating to and from bathroom this shift, no shortness of breath noted.  Patient denying shortness of breath today as well.

## 2016-08-07 NOTE — Progress Notes (Signed)
ANTICOAGULATION CONSULT NOTE Pharmacy Consult for Heparin Indication: atrial fibrillation  Allergies  Allergen Reactions  . Penicillins Other (See Comments)    Unknown    Patient Measurements: Height: 5\' 11"  (180.3 cm) Weight: 190 lb 14.4 oz (86.6 kg) IBW/kg (Calculated) : 75.3 kg   Vital Signs: Temp: 98.8 F (37.1 C) (05/12 0947) Temp Source: Oral (05/12 0947) BP: 103/62 (05/12 0947)  Labs:  Recent Labs  08/05/16 0324 08/06/16 0359 08/06/16 1455 08/07/16 0500 08/07/16 1845  HGB 11.8* 12.2*  --  12.3*  --   HCT 36.6* 36.8*  --  38.7*  --   PLT 87* 87*  --  89*  --   LABPROT 18.8* 17.8*  --  18.7*  --   INR 1.56 1.45  --  1.55  --   HEPARINUNFRC 0.33 0.32  --  0.24* 0.43  CREATININE 2.89*  2.84* 2.43* 2.12* 1.88*  --     Estimated Creatinine Clearance: 38.4 mL/min (A) (by C-G formula based on SCr of 1.88 mg/dL (H)).   Medical History: Past Medical History:  Diagnosis Date  . Cardiomyopathy (HCC)    EF 40% s/p dual chamber AICD  . CHF (congestive heart failure) (HCC)   . CKD (chronic kidney disease)    told by his PCP about 8 months ago and was seen by a nephrologist and his ACE I was stopped.    . Coronary artery disease 2012   s/p MI 2012 with PCI and 2 stents  . Diverticulitis   . Peripheral neuropathy     Assessment: 71 [facility] with new onset Afib RVR and AKI. Pharmacy  consulted to manage heparin bridge to coumadin. No oral anticoagulation PTA.   Evening heparin level is therapeutic, mild thrombocytopenia noted.   Goal of Therapy:  Heparin level 0.3-0.7 units/ml Monitor platelets by anticoagulation protocol: Yes    Plan:  Continue heparin at 1450 units/hr Daily HL, CBC Monitor for s/sx bleeding     Agapito GamesAlison Conrad Zajkowski, PharmD, BCPS Clinical Pharmacist 08/07/2016 7:40 PM

## 2016-08-07 NOTE — Progress Notes (Signed)
ANTICOAGULATION CONSULT NOTE - follow up Pharmacy Consult for Heparin and Coumadn Indication: atrial fibrillation  Allergies  Allergen Reactions  . Penicillins Other (See Comments)    Unknown    Patient Measurements: Height: 5\' 11"  (180.3 cm) Weight: 190 lb 14.4 oz (86.6 kg) IBW/kg (Calculated) : 75.3 kg   Vital Signs: Temp: 98 F (36.7 C) (05/12 0446) Temp Source: Oral (05/12 0446) BP: 115/66 (05/12 0446) Pulse Rate: 76 (05/12 0446)  Labs:  Recent Labs  08/05/16 0324 08/06/16 0359 08/06/16 1455 08/07/16 0500  HGB 11.8* 12.2*  --  12.3*  HCT 36.6* 36.8*  --  38.7*  PLT 87* 87*  --  89*  LABPROT 18.8* 17.8*  --  18.7*  INR 1.56 1.45  --  1.55  HEPARINUNFRC 0.33 0.32  --  0.24*  CREATININE 2.89*  2.84* 2.43* 2.12* 1.88*    Estimated Creatinine Clearance: 38.4 mL/min (A) (by C-G formula based on SCr of 1.88 mg/dL (H)).   Medical History: Past Medical History:  Diagnosis Date  . Cardiomyopathy (HCC)    EF 40% s/p dual chamber AICD  . CHF (congestive heart failure) (HCC)   . CKD (chronic kidney disease)    told by his PCP about 8 months ago and was seen by a nephrologist and his ACE I was stopped.    . Coronary artery disease 2012   s/p MI 2012 with PCI and 2 stents  . Diverticulitis   . Peripheral neuropathy     Assessment: 71 [facility] with new onset Afib RVR and AKI. Pharmacy  consulted on to manage heparin bridge to coumadin. No oral anticoagulation PTA. Heparin level is subtherapeutic at 0.24 - RN reports no issues with line overnight. Baseline INR=1.59 and LFTs markedly increased after one dose of warfarin. LFTs now trending down. INR is subtherapeutic at 1.55 today. H&H remains stable and plt low/stable. No overt s/s bleeding noted.   Goal of Therapy:  INR 2-3 Heparin level 0.3-0.7 units/ml Monitor platelets by anticoagulation protocol: Yes   Plan:  Increase heparin gtt to 1350 units/hr  Heparin level in 8 hours   Coumadin 5 mg po x1 (cautious with increasing dose due to elevated LFTs, baseline INR >1.4) Daily HL, INR and CBC Monitor for s/s bleeding   York CeriseKatherine Cook, PharmD Pharmacy Resident  Pager [phone] 08/07/16 9:24 AM

## 2016-08-07 NOTE — Progress Notes (Addendum)
S: no new CO.  Says he is ready to go home O:BP 115/66 (BP Location: Right Arm)   Pulse 76   Temp 98 F (36.7 C) (Oral)   Resp 20   Ht 5\' 11"  (1.803 m)   Wt 86.6 kg (190 lb 14.4 oz)   SpO2 97%   BMI 26.63 kg/m   Intake/Output Summary (Last 24 hours) at 08/07/16 0710 Last data filed at 08/07/16 0700  Gross per 24 hour  Intake            862.4 ml  Output             1125 ml  Net           -262.6 ml   Weight change: -0.045 kg (-1.6 oz) ONG:EXBMWGen:awake and alert CVS: RRR w PVC's Resp: faint basilar crackles Abd:+ BS NTND Ext: No edema NEURO:CNI Ox3 no asterixis   . clopidogrel  75 mg Oral Daily  . diltiazem  120 mg Oral Daily  . DULoxetine  90 mg Oral Daily  . folic acid  1 mg Oral Daily  . metoprolol tartrate  50 mg Oral BID  . multivitamin with minerals  1 tablet Oral Daily  . thiamine  100 mg Oral Daily  . Warfarin - Pharmacist Dosing Inpatient   Does not apply q1800   No results found. BMET    Component Value Date/Time   NA 141 08/07/2016 0500   K 4.2 08/07/2016 0500   CL 99 (L) 08/07/2016 0500   CO2 32 08/07/2016 0500   GLUCOSE 107 (H) 08/07/2016 0500   BUN 46 (H) 08/07/2016 0500   CREATININE 1.88 (H) 08/07/2016 0500   CALCIUM 9.1 08/07/2016 0500   GFRNONAA 34 (L) 08/07/2016 0500   GFRAA 40 (L) 08/07/2016 0500   CBC    Component Value Date/Time   WBC 8.2 08/07/2016 0500   RBC 3.97 (L) 08/07/2016 0500   HGB 12.3 (L) 08/07/2016 0500   HCT 38.7 (L) 08/07/2016 0500   PLT 89 (L) 08/07/2016 0500   MCV 97.5 08/07/2016 0500   MCH 31.0 08/07/2016 0500   MCHC 31.8 08/07/2016 0500   RDW 14.0 08/07/2016 0500   LYMPHSABS 2.2 08/02/2016 2157   MONOABS 0.7 08/02/2016 2157   EOSABS 0.0 08/02/2016 2157   BASOSABS 0.0 08/02/2016 2157     Assessment: 1. Acute Renal failure  [facility] shoulder so no IV contrast was used.  Scr cont to improve improve  Suspect the cause of ARF is ATN from episodic hypotension in face of A fib 2.  A fib, now NSR.  On heparin and coumadin 3. Thrombocytopenia 4. Hx CAD 5. Pulm edema 6. hypokalemia Plan: 1. Scr down to 1.88.  Anticipate this to cont.  [facility]  Will sign off.  Raechelle Sarti T

## 2016-08-07 NOTE — Progress Notes (Signed)
Pharmacist Heart Failure Core Measure Documentation  Assessment: Jerry Baker has an EF documented as 30-35% on 5/10 by ECHO.  Rationale: Heart failure patients with left ventricular systolic dysfunction (LVSD) and an EF < 40% should be prescribed an angiotensin converting enzyme inhibitor (ACEI) or angiotensin receptor blocker (ARB) at discharge unless a contraindication is documented in the medical record.  This patient is not currently on an ACEI or ARB for HF.  This note is being placed in the record in order to provide documentation that a contraindication to the use of these agents is present for this encounter.  ACE Inhibitor or Angiotensin Receptor Blocker is contraindicated (specify all that apply)  []   ACEI allergy AND ARB allergy []   Angioedema []   Moderate or severe aortic stenosis []   Hyperkalemia []   Hypotension []   Renal artery stenosis [x]   Worsening renal function/AKI - could consider in future if AKI resolves and blood pressure tolerates.   Carylon PerchesMaggie Shuda, PharmD Acute Care Pharmacy Resident  Pager: [phone] 08/07/2016

## 2016-08-07 NOTE — Progress Notes (Signed)
[facility] Progress [facility]  Jerry Baker 71 y.o. 1945-08-28  CC:  Abnormal LFTs   Subjective: No acute GI issues noted. Patient tolerating diet.  ROS : Negative for chest pain or shortness of breath.   Objective: Vital signs in last 24 hours: Vitals:   08/07/16 0446 08/07/16 0947  BP: 115/66 103/62  Pulse: 76   Resp: 20 14  Temp: 98 F (36.7 C) 98.8 F (37.1 C)    Physical Exam:  General:  Alert, cooperative, no distress, appears stated age  Head:  Normocephalic, without obvious abnormality, atraumatic  Eyes:  , EOM's intact,   Lungs:   Clear to auscultation bilaterally, respirations unlabored  Heart:  Regular rate and rhythm, S1, S2 normal  Abdomen:   Soft, non-tender, bowel sounds active all four quadrants,  no masses,           Lab Results:  Recent Labs  08/05/16 0324 08/05/16 1839  08/06/16 1455 08/07/16 0500  NA 138  137  --   < > 138 141  K 3.2*  3.4* 3.2*  < > 3.9 4.2  CL 100*  101  --   < > 98* 99*  CO2 26  21*  --   < > 30 32  GLUCOSE 93  84  --   < > 153* 107*  BUN 69*  70*  --   < > 55* 46*  CREATININE 2.89*  2.84*  --   < > 2.12* 1.88*  CALCIUM 8.4*  8.4*  --   < > 9.0 9.1  MG  --  2.1  --   --   --   PHOS 5.1*  --   --   --   --   < > = values in this interval not displayed.  Recent Labs  08/06/16 0359 08/07/16 0500  AST 483* 261*  ALT 1,220* 933*  ALKPHOS 89 84  BILITOT 1.7* 1.4*  PROT 6.1* 6.0*  ALBUMIN 3.5 3.2*    Recent Labs  08/06/16 0359 08/07/16 0500  WBC 6.9 8.2  HGB 12.2* 12.3*  HCT 36.8* 38.7*  MCV 95.8 97.5  PLT 87* 89*    Recent Labs  08/06/16 0359 08/07/16 0500  LABPROT 17.8* 18.7*  INR 1.45 1.55      Assessment/Plan: - Abnormal LFTs. Most likely ischemic hepatitis given in association with acute kidney injury, atrial fibrillation and ischemic cardiopathy - Ischemic cardiomyopathy - Acute kidney injury - Thrombocytopenia. Negative CT and ultrasound for any evidence of cirrhosis.    Recommendations --------------------------- - Patient with negative hepatitis panel. Ultrasound negative for any acute changes. LFTs trending down. Monitor LFTs on a daily basis at least while patient in the hospital. It may take a few weeks before normalization of liver function tests. Patient will need close outpatient follow-up to monitor LFTs. He may need workup for underlying liver disease if continues to have elevated LFTs. GI will follow.   Kathi DerParag Yocelyn Brocious MD, FACP 08/07/2016, 2:57 PM  Pager [phone]  If no answer or after 5 PM call [phone]

## 2016-08-08 LAB — PROTIME-INR
INR: 2.05
Prothrombin Time: 23.4 seconds — ABNORMAL HIGH (ref 11.4–15.2)

## 2016-08-08 LAB — COMPREHENSIVE METABOLIC PANEL
ALT: 729 U/L — AB (ref 17–63)
AST: 178 U/L — ABNORMAL HIGH (ref 15–41)
Albumin: 3.3 g/dL — ABNORMAL LOW (ref 3.5–5.0)
Alkaline Phosphatase: 81 U/L (ref 38–126)
Anion gap: 12 (ref 5–15)
BUN: 35 mg/dL — ABNORMAL HIGH (ref 6–20)
CHLORIDE: 105 mmol/L (ref 101–111)
CO2: 24 mmol/L (ref 22–32)
CREATININE: 1.51 mg/dL — AB (ref 0.61–1.24)
Calcium: 9.1 mg/dL (ref 8.9–10.3)
GFR calc Af Amer: 52 mL/min — ABNORMAL LOW (ref >60)
GFR calc non Af Amer: 45 mL/min — ABNORMAL LOW (ref >60)
Glucose, Bld: 98 mg/dL (ref 65–99)
POTASSIUM: 4.4 mmol/L (ref 3.5–5.1)
Sodium: 141 mmol/L (ref 135–145)
Total Bilirubin: 1.4 mg/dL — ABNORMAL HIGH (ref 0.3–1.2)
Total Protein: 6 g/dL — ABNORMAL LOW (ref 6.5–8.1)

## 2016-08-08 LAB — HEPARIN LEVEL (UNFRACTIONATED): Heparin Unfractionated: 0.3 IU/mL (ref 0.30–0.70)

## 2016-08-08 MED ORDER — WARFARIN SODIUM 5 MG PO TABS
5.0000 mg | ORAL_TABLET | Freq: Once | ORAL | Status: AC
  Administered 2016-08-08: 5 mg via ORAL
  Filled 2016-08-08: qty 1

## 2016-08-08 NOTE — Evaluation (Signed)
Physical Therapy Evaluation Patient Details Name: Jerry Baker MRN: [id number] DOB: 1945/12/18 Today's Date: 08/08/2016   History of Present Illness  Patient is a 71 y/o male who presents with SOB and transferred from [facility]. Found to have A-fib with RVR, acute renal failure and acute pancreatitis. PMH includes pacemaker and AICD, peripheral neuropathy, CAD, CKD, CHF, cardiomyopathy.  Clinical Impression  Patient presents close to functional baseline and able to ambulate community distances and negotiate steps without difficulty. Mild drifting noted initially but this improved with increased distance. HR stable. No SOB. Pt independent PTA. Has support from wife at home. Encouraged ambulation multiple times per day while in hospital. Pt does not require skilled therapy services. DIscharge from therapy.    Follow Up Recommendations No PT follow up;Supervision - Intermittent    Equipment Recommendations  None recommended by PT    Recommendations for Other Services       Precautions / Restrictions Precautions Precautions: None Restrictions Weight Bearing Restrictions: No      Mobility  Bed Mobility Overal bed mobility: Independent             General bed mobility comments: No assist needed.   Transfers Overall transfer level: Independent               General transfer comment: Stood from EOB without difficulty.   Ambulation/Gait Ambulation/Gait assistance: Modified independent (Device/Increase time) Ambulation Distance (Feet): 350 Feet Assistive device: None Gait Pattern/deviations: Step-through pattern;Drifts right/left     General Gait Details: Mostly steady gait with mild drifting noted initially but this improved with increased distance. HR stable. no SOB.  Stairs Stairs: Yes Stairs assistance: Modified independent (Device/Increase time) Stair Management: Alternating pattern Number of Stairs: 4 General stair comments: Limited by lines or pt  could have done entire flight.  Wheelchair Mobility    Modified Rankin (Stroke Patients Only)       Balance Overall balance assessment: Needs assistance Sitting-balance support: Feet supported;No upper extremity supported Sitting balance-Leahy Scale: Normal Sitting balance - Comments: ABle to reach outside BoS and donn socks without difficulty.   Standing balance support: During functional activity Standing balance-Leahy Scale: Good                               Pertinent Vitals/Pain Pain Assessment: No/denies pain    Home Living Family/patient expects to be discharged to:: Private residence Living Arrangements: Spouse/significant other Available Help at Discharge: Family Type of Home: House Home Access: Level entry     Home Layout: Two level Home Equipment: None      Prior Function Level of Independence: Independent         Comments: Cooks, cleans, drives.      Hand Dominance   Dominant Hand: Right    Extremity/Trunk Assessment   Upper Extremity Assessment Upper Extremity Assessment: Defer to OT evaluation (Reports difficulty with right shoulder- in process of getting imaging read before this admission.)    Lower Extremity Assessment Lower Extremity Assessment: Overall WFL for tasks assessed       Communication   Communication: No difficulties  Cognition Arousal/Alertness: Awake/alert Behavior During Therapy: WFL for tasks assessed/performed Overall Cognitive Status: Within Functional Limits for tasks assessed                                        General Comments  Exercises     Assessment/Plan    PT Assessment Patent does not need any further PT services  PT Problem List         PT Treatment Interventions      PT Goals (Current goals can be found in the Care Plan section)  Acute Rehab PT Goals Patient Stated Goal: to get home and get some sleep PT Goal Formulation: All assessment and education  complete, DC therapy    Frequency     Barriers to discharge        Co-evaluation               AM-PAC PT "6 Clicks" Daily Activity  Outcome Measure Difficulty turning over in bed (including adjusting bedclothes, sheets and blankets)?: None Difficulty moving from lying on back to sitting on the side of the bed? : None Difficulty sitting down on and standing up from a chair with arms (e.g., wheelchair, bedside commode, etc,.)?: None Help needed moving to and from a bed to chair (including a wheelchair)?: None Help needed walking in hospital room?: None Help needed climbing 3-5 steps with a railing? : None 6 Click Score: 24    End of Session Equipment Utilized During Treatment: Gait belt Activity Tolerance: Patient tolerated treatment well Patient left: in bed;with call bell/phone within reach Nurse Communication: Mobility status PT Visit Diagnosis: Unsteadiness on feet (R26.81)    Time: [id number] PT Time Calculation (min) (ACUTE ONLY): 16 min   Charges:   PT Evaluation $PT Eval Low Complexity: 1 Procedure     PT G CodesMylo Red:        Atreyu Mak, PT, DPT [phone]    Grit.Laddhauna A Nellene Courtois 08/08/2016, 8:27 AM

## 2016-08-08 NOTE — Progress Notes (Signed)
To MD on rounds, pt is taking Vit B12. This has not been ordered. Pt is curious if He will continue with the medicine as he is using it for his neuropathy. He doeshave an order for Vit B1 ( thiamine) which he has not been taking before. Kindly address with patient.

## 2016-08-08 NOTE — Progress Notes (Addendum)
PROGRESS NOTE    Jerry Baker  NWG:[id number] DOB: 07-17-1945 DOA: 08/02/2016 PCP: System, Pcp Not In    Brief Narrative: Jerry Hazyhomas Lawlessis a 71 y.o.malewith history of CAD status post stenting, cardiomyopathy last EF measured was 40% with AICD and pacemaker placement, peripheral neuropathy [facility] ER [facility] with complaints of shortness of breath. He was found to be in afib with RVR, acute renal failure and in acute pancreatitis. He had an episode of polymorphic ventricular tachycardia on 5/10.   Assessment & Plan:   Principal Problem:   Atrial fibrillation with RVR (HCC) Active Problems:   Cardiomyopathy (HCC)   Diverticulitis   Peripheral neuropathy   Coronary artery disease involving native coronary artery of native heart without angina pectoris   AKI (acute kidney injury) (HCC)   Elevated troponin  Atrial fibrillation with RVR and episodes of Torsades on 5/10: Maintaining NSR currently. AICD interrogation showed nonsustained polymorphic VTach which spontaneously terminated < 1 sec prior to shock from AICD.  - EP consulted, recommending ischemic work up which is planned when renal function improves, possibly 5/14 (NPO p MN per cardiology) - Keep K > 4 and Mg > 2 - Continue telemetry - Heparin bridge to coumadin started 5/8, anticipate 2nd of 2 therapeutic INRs on 5/14 and discontinuation of heparin. Wonder if NOAC would eventually be more practical anticoagulation when/if renal function improves.   - Continue cardizem  Cardiomyopathy and acute on chronic systolic CHF: EF 57-84%30-35% - Per cardiology. Considering stress testing vs. cath pending creatinine in AM. Troponin elevation thought to be due to demand ischemia.  - Holding lasix since 5/11.   Acute kidney injury: Due to ATN from hypotension due to AFib. Improving. - Holding lasix and monitor per nephrology recommendation.   - Avoid nephrotoxins - [facility]  Alcohol abuse: CIWA scores  have consistently remained zero.  - DC'ed CIWA.  - Continue MVM's - Takes vitamin B12 for associated neuropathy. Level is extremely elevated at 5,493 so this will be held.   Elevated liver function tests: US abdomen shows some gall bladder sludge and CBD at 0.9 cm, no stones or evidence of obstruction. - GI consulted, suspect due to shock liver/ischemic hepatopathy, since improving. Precipitous improvement not consistent with statin-induced hepatopathy - Will need close follow up as outpatient - Monitor, improving.  Acute mild pancreatitis: Resolved, lipase levels wnl. No abd pain.  - Tolerating diet, resolved.   Mild anemia: AOCD, mild, stable. Elevated LDH likely not indicative of hemolysis.  - Monitor  Acute thrombocytopenia: Heparin and antiplatelets still ok if plt >50k per oncology.  - Monitor. Dr. Bertis RuddyGorsuch has evaluated patient.   DVT prophylaxis: Heparin gtt, coumadin.  Code Status: Full Family Communication: None at bedside Disposition Plan: Pending heparin bridge to coumadin. Will get PT consult 5/13  Consultants:   Cardiology  Nephrology  Gastroenterology, Dr. Matthias HughsBuccini, Dr. Levora AngelBrahmbhatt.   Procedures:  Echocardiogram 08/05/2016: - Left ventricle: The cavity size was moderately dilated. Wall   thickness was normal. Systolic function was moderately to   severely reduced. The estimated ejection fraction was in the   range of 30% to 35%. Diffuse hypokinesis. Doppler parameters are   consistent with a reversible restrictive pattern, indicative of   decreased left ventricular diastolic compliance and/or increased   left atrial pressure (grade 3 diastolic dysfunction). - Aortic valve: Trileaflet; mildly thickened, mildly calcified   leaflets. There was trivial regurgitation. - Mitral valve: There was severe regurgitation. - Left atrium:  The atrium was severely dilated. - Right ventricle: The cavity size was mildly dilated. Wall   thickness was normal. Pacer wire or  catheter noted in right   ventricle. Systolic function was moderately reduced. - Pulmonary arteries: Systolic pressure was mildly increased. PA   peak pressure: 35 mm Hg (S).  U/S abdomen 08/04/2016: Small amount of gallbladder sludge. Negative for stones or cholecystitis. Negative for intrahepatic biliary ductal dilatation. The distal common bile duct measures 0.9 cm but no stone or other evidence of obstruction is seen.   Antimicrobials: none   Subjective: No further dyspnea or chest pain. Urinating. Eating, ambulating.   Objective: Vitals:   08/07/16 2319 08/08/16 0412 08/08/16 0803 08/08/16 1155  BP: 116/74 117/88 117/72 110/69  Pulse: 73 73 77 68  Resp: 15 18 19 20   Temp: 98.5 F (36.9 C) 98 F (36.7 C) 98.1 F (36.7 C) 98.1 F (36.7 C)  TempSrc:   Oral Oral  SpO2: 96% 96% 96% 97%  Weight:  85.9 kg (189 lb 6.4 oz)    Height:        Intake/Output Summary (Last 24 hours) at 08/08/16 1544 Last data filed at 08/08/16 1232  Gross per 24 hour  Intake             1290 ml  Output              950 ml  Net              340 ml   Filed Weights   08/06/16 0451 08/07/16 0446 08/08/16 0412  Weight: 86.6 kg (191 lb) 86.6 kg (190 lb 14.4 oz) 85.9 kg (189 lb 6.4 oz)   Examination: General exam: 70yo M in no distress Respiratory system: Clear to auscultation. Respiratory effort normal. Cardiovascular system: S1 & S2 heard, RRR. No JVD, murmurs, rubs, gallops or clicks.  Gastrointestinal system: Abdomen is soft, nontender and nondistended. No organomegaly or masses felt. Normal bowel sounds heard. Central nervous system: Alert and oriented. No focal neurological deficits. Extremities: No edema or deformities. Skin: No rashes, lesions or ulcers. Nontender diffuse ecchymosis on left volar forearm. Psychiatry: Judgement and insight appear normal. Euthymic mood, congruent affect.    Data Reviewed: I have personally reviewed following labs and imaging studies  CBC:  Recent  Labs Lab 08/02/16 2157 08/03/16 0422 08/03/16 0727 08/04/16 0231 08/05/16 0324 08/06/16 0359 08/07/16 0500  WBC 10.4 8.4  --  11.8* 8.9 6.9 8.2  NEUTROABS 7.4  --   --   --   --   --   --   HGB 12.9* 12.4*  --  12.3* 11.8* 12.2* 12.3*  HCT 38.8* 37.8*  --  37.7* 36.6* 36.8* 38.7*  MCV 95.8 95.2  --  96.7 95.6 95.8 97.5  PLT 108* 101* 95* 118* 87* 87* 89*   Basic Metabolic Panel:  Recent Labs Lab 08/03/16 0422 08/04/16 0231 08/05/16 0324 08/05/16 1839 08/06/16 0359 08/06/16 1455 08/07/16 0500 08/08/16 0649  NA 137 136  136 138  137  --  138 138 141 141  K 5.5* 4.8  4.7 3.2*  3.4* 3.2* 3.4* 3.9 4.2 4.4  CL 106 103  103 100*  101  --  95* 98* 99* 105  CO2 20* 21*  20* 26  21*  --  32 30 32 24  GLUCOSE 153* 118*  119* 93  84  --  100* 153* 107* 98  BUN 61* 70*  70* 69*  70*  --  60* 55* 46* 35*  CREATININE 3.35* 3.75*  3.86* 2.89*  2.84*  --  2.43* 2.12* 1.88* 1.51*  CALCIUM 8.7* 8.3*  8.4* 8.4*  8.4*  --  8.7* 9.0 9.1 9.1  MG 2.4  --   --  2.1  --   --   --   --   PHOS  --  7.3* 5.1*  --   --   --   --   --    GFR: Estimated Creatinine Clearance: 47.8 mL/min (A) (by C-G formula based on SCr of 1.51 mg/dL (H)). Liver Function Tests:  Recent Labs Lab 08/04/16 0600 08/05/16 0324 08/06/16 0359 08/07/16 0500 08/08/16 0649  AST 709* 738* 483* 261* 178*  ALT 938* 1,327* 1,220* 933* 729*  ALKPHOS 89 84 89 84 81  BILITOT 2.0* 1.5* 1.7* 1.4* 1.4*  PROT 6.3* 5.9* 6.1* 6.0* 6.0*  ALBUMIN 3.7 3.5  3.5 3.5 3.2* 3.3*    Recent Labs Lab 08/03/16 0539  LIPASE 21   Coagulation Profile:  Recent Labs Lab 08/04/16 0231 08/05/16 0324 08/06/16 0359 08/07/16 0500 08/08/16 0649  INR 1.78 1.56 1.45 1.55 2.05   Cardiac Enzymes:  Recent Labs Lab 08/02/16 2157 08/03/16 0422 08/03/16 0932 08/04/16 0323  TROPONINI 0.23* 0.23* 0.17* 0.19*   Sepsis Labs:  Recent Labs Lab 08/03/16 0422  LATICACIDVEN 1.9    Recent Results (from the past 240  hour(s))  MRSA PCR Screening     Status: None   Collection Time: 08/07/16  8:31 PM  Result Value Ref Range Status   MRSA by PCR NEGATIVE NEGATIVE Final    Comment:        The GeneXpert MRSA Assay (FDA approved for NASAL specimens only), is one component of a comprehensive MRSA colonization surveillance program. It is not intended to diagnose MRSA infection nor to guide or monitor treatment for MRSA infections.      Radiology Studies: No results found.  Scheduled Meds: . clopidogrel  75 mg Oral Daily  . diltiazem  120 mg Oral Daily  . DULoxetine  90 mg Oral Daily  . folic acid  1 mg Oral Daily  . metoprolol tartrate  50 mg Oral BID  . multivitamin with minerals  1 tablet Oral Daily  . thiamine  100 mg Oral Daily  . warfarin  5 mg Oral ONCE-1800  . Warfarin - Pharmacist Dosing Inpatient   Does not apply q1800   Continuous Infusions: . heparin 1,350 Units/hr (08/08/16 0713)    LOS: 6 days   Time spent: 25 minutes.   Hazeline Junker, MD [facility] Pager [phone]   If 7PM-7AM, please contact night-coverage [email or web address] Password [facility] 08/08/2016, 3:44 PM

## 2016-08-08 NOTE — Progress Notes (Signed)
[facility] Progress Note  Jerry Baker 71 y.o. Aug 01, 1945  CC:  Abnormal LFTs   Subjective: No acute GI issues noted. Patient tolerating diet.  ROS : Negative for chest pain or shortness of breath.   Objective: Vital signs in last 24 hours: Vitals:   08/08/16 0412 08/08/16 0803  BP: 117/88 117/72  Pulse: 73 77  Resp: 18 19  Temp: 98 F (36.7 C) 98.1 F (36.7 C)    Physical Exam:  General:  Alert, cooperative, no distress, appears stated age  Head:  Normocephalic, without obvious abnormality, atraumatic  Eyes:  , EOM's intact,   Lungs:   Clear to auscultation bilaterally, respirations unlabored  Heart:  Regular rate and rhythm, S1, S2 normal  Abdomen:   Soft, non-tender, bowel sounds active all four quadrants,  no masses,           Lab Results:  Recent Labs  08/05/16 1839  08/07/16 0500 08/08/16 0649  NA  --   < > 141 141  K 3.2*  < > 4.2 4.4  CL  --   < > 99* 105  CO2  --   < > 32 24  GLUCOSE  --   < > 107* 98  BUN  --   < > 46* 35*  CREATININE  --   < > 1.88* 1.51*  CALCIUM  --   < > 9.1 9.1  MG 2.1  --   --   --   < > = values in this interval not displayed.  Recent Labs  08/07/16 0500 08/08/16 0649  AST 261* 178*  ALT 933* 729*  ALKPHOS 84 81  BILITOT 1.4* 1.4*  PROT 6.0* 6.0*  ALBUMIN 3.2* 3.3*    Recent Labs  08/06/16 0359 08/07/16 0500  WBC 6.9 8.2  HGB 12.2* 12.3*  HCT 36.8* 38.7*  MCV 95.8 97.5  PLT 87* 89*    Recent Labs  08/07/16 0500 08/08/16 0649  LABPROT 18.7* 23.4*  INR 1.55 2.05      Assessment/Plan: - Abnormal LFTs. Most likely ischemic hepatitis given in association with acute kidney injury, atrial fibrillation and ischemic cardiopathy - Ischemic cardiomyopathy - Acute kidney injury - Thrombocytopenia. Negative CT and ultrasound for any evidence of cirrhosis.   Recommendations --------------------------- - LFT continues to trend down. Patient with negative hepatitis panel. Ultrasound negative  for any acute changes. Mild elevated INR is probably secondary to Coumadin. It may take a few weeks before normalization of liver function tests. Patient will need close outpatient follow-up to monitor LFTs. He may need workup for underlying liver disease if continues to have elevated LFTs. GI will sign off. Follow-up in GI clinic in 2 weeks after discharge.   Kathi DerParag Nicolae Vasek MD, FACP 08/08/2016, 9:49 AM  Pager [phone]  If no answer or after 5 PM call [phone]

## 2016-08-08 NOTE — Plan of Care (Signed)
Problem: Bowel/Gastric: Goal: Will not experience complications related to bowel motility Outcome: Completed/Met Date Met: 08/08/16 Per patient report has had normal bowel movements for the past two days.

## 2016-08-08 NOTE — Progress Notes (Signed)
Patient comfortable on room air and without distress. BiPAP not indicated at this time. RT will continue to monitor as needed.

## 2016-08-08 NOTE — Progress Notes (Signed)
ANTICOAGULATION CONSULT NOTE - follow up Pharmacy Consult for Heparin and Coumadn Indication: atrial fibrillation  Allergies  Allergen Reactions  . Penicillins Other (See Comments)    Unknown    Patient Measurements: Height: 5\' 11"  (180.3 cm) Weight: 189 lb 6.4 oz (85.9 kg) IBW/kg (Calculated) : 75.3 kg   Vital Signs: Temp: 98.1 F (36.7 C) (05/13 0803) Temp Source: Oral (05/13 0803) BP: 117/72 (05/13 0803) Pulse Rate: 77 (05/13 0803)  Labs:  Recent Labs  08/06/16 0359 08/06/16 1455 08/07/16 0500 08/07/16 1845 08/08/16 0649  HGB 12.2*  --  12.3*  --   --   HCT 36.8*  --  38.7*  --   --   PLT 87*  --  89*  --   --   LABPROT 17.8*  --  18.7*  --  23.4*  INR 1.45  --  1.55  --  2.05  HEPARINUNFRC 0.32  --  0.24* 0.43 0.30  CREATININE 2.43* 2.12* 1.88*  --   --     Estimated Creatinine Clearance: 38.4 mL/min (A) (by C-G formula based on SCr of 1.88 mg/dL (H)).   Medical History: Past Medical History:  Diagnosis Date  . Cardiomyopathy (HCC)    EF 40% s/p dual chamber AICD  . CHF (congestive heart failure) (HCC)   . CKD (chronic kidney disease)    told by his PCP about 8 months ago and was seen by a nephrologist and his ACE I was stopped.    . Coronary artery disease 2012   s/p MI 2012 with PCI and 2 stents  . Diverticulitis   . Peripheral neuropathy     Assessment: 71 y.o male [facility] with new onset Afib RVR and AKI. Pharmacy  consulted on to manage heparin bridge to coumadin. No oral anticoagulation PTA. Heparin level is therapeutic at 0.30. Baseline INR=1.59 and LFTs markedly increased after one dose of warfarin. LFTs now trending down. INR is therapeutic at 2.05 today. H&H remains stable and plt low/stable. No overt s/s bleeding noted.   Goal of Therapy:  INR 2-3 Heparin level 0.3-0.7 units/ml Monitor platelets by anticoagulation protocol: Yes   Plan:  Continue heparin gtt at 1350 units/hr  Coumadin 5 mg po x1 (cautious  with increasing dose due to elevated LFTs, baseline INR >1.4) Daily HL, INR and CBC Monitor for s/s bleeding  F/u with MD re: d/c heparin gtt   York CeriseKatherine Cook, PharmD Pharmacy Resident  Pager [phone] 08/08/16 8:37 AM

## 2016-08-08 NOTE — Progress Notes (Addendum)
Subjective:  No complaints of chest pain or shortness of breath.  Renal function continues to improve but still mildly elevated.  Objective:  Vital Signs in the last 24 hours: BP 117/72 (BP Location: Left Arm)   Pulse 77   Temp 98.1 F (36.7 C) (Oral)   Resp 19   Ht 5\' 11"  (1.803 m)   Wt 85.9 kg (189 lb 6.4 oz)   SpO2 96%   BMI 26.42 kg/m   Physical Exam: Pleasant mildly obese white male in no acute distress Lungs:  Clear, healed ICD incision in the left subclavicular area Cardiac:  Regular rhythm, normal S1 and S2, no S3 Extremities:  No edema present  Intake/Output from previous day: 05/12 0701 - 05/13 0700 In: 1646.5 [P.O.:1340; I.V.:306.5] Out: 500 [Urine:500]  Weight Filed Weights   08/06/16 0451 08/07/16 0446 08/08/16 0412  Weight: 86.6 kg (191 lb) 86.6 kg (190 lb 14.4 oz) 85.9 kg (189 lb 6.4 oz)    Lab Results: Basic Metabolic Panel:  Recent Labs  40/98/1104/03/15 0500 08/08/16 0649  NA 141 141  K 4.2 4.4  CL 99* 105  CO2 32 24  GLUCOSE 107* 98  BUN 46* 35*  CREATININE 1.88* 1.51*   CBC:  Recent Labs  08/06/16 0359 08/07/16 0500  WBC 6.9 8.2  HGB 12.2* 12.3*  HCT 36.8* 38.7*  MCV 95.8 97.5  PLT 87* 89*   Cardiac Enzymes:  Telemetry: Sinus rhythm, occasional PVCs and occasional couplets, no sustained torsade noted  Assessment/Plan:  1.  Ischemic cardiomyopathy 2.  Polymorphic ventricular tachycardia previously seen but only PVCs at the present time. 3.  Acute renal failure improving  4.  Elevation of liver enzymes improving  Recommendations:  Renal function continues to improve.  His polymorphic ventricular tachycardia is usually associated with ischemia and spoke with Dr. Ladona Ridgelaylor.  He mentioned that usually catheterizations the best way to evaluate this.  His renal function and liver tests are continuing to improve.  It may be best to continue to follow renal function and liver tests and give him some time and then have a catheterization at a  later date.  He is mildly thrombocytopenic still.   Darden PalmerW. Spencer Tilley, Jr.  MD [facility]  08/08/2016, 11:04 AM

## 2016-08-09 DIAGNOSIS — I472 Ventricular tachycardia: Secondary | ICD-10-CM

## 2016-08-09 LAB — COMPREHENSIVE METABOLIC PANEL
ALT: 587 U/L — ABNORMAL HIGH (ref 17–63)
ANION GAP: 10 (ref 5–15)
AST: 113 U/L — ABNORMAL HIGH (ref 15–41)
Albumin: 3.4 g/dL — ABNORMAL LOW (ref 3.5–5.0)
Alkaline Phosphatase: 89 U/L (ref 38–126)
BUN: 33 mg/dL — ABNORMAL HIGH (ref 6–20)
CHLORIDE: 102 mmol/L (ref 101–111)
CO2: 32 mmol/L (ref 22–32)
CREATININE: 1.42 mg/dL — AB (ref 0.61–1.24)
Calcium: 9.4 mg/dL (ref 8.9–10.3)
GFR, EST AFRICAN AMERICAN: 56 mL/min — AB (ref >60)
GFR, EST NON AFRICAN AMERICAN: 48 mL/min — AB (ref >60)
Glucose, Bld: 100 mg/dL — ABNORMAL HIGH (ref 65–99)
POTASSIUM: 4.7 mmol/L (ref 3.5–5.1)
SODIUM: 144 mmol/L (ref 135–145)
Total Bilirubin: 1.4 mg/dL — ABNORMAL HIGH (ref 0.3–1.2)
Total Protein: 6.3 g/dL — ABNORMAL LOW (ref 6.5–8.1)

## 2016-08-09 LAB — CBC
HCT: 40.4 % (ref 39.0–52.0)
HEMOGLOBIN: 13.2 g/dL (ref 13.0–17.0)
MCH: 32.4 pg (ref 26.0–34.0)
MCHC: 32.7 g/dL (ref 30.0–36.0)
MCV: 99.3 fL (ref 78.0–100.0)
Platelets: 76 10*3/uL — ABNORMAL LOW (ref 150–400)
RBC: 4.07 MIL/uL — AB (ref 4.22–5.81)
RDW: 14.5 % (ref 11.5–15.5)
WBC: 10 10*3/uL (ref 4.0–10.5)

## 2016-08-09 LAB — HEPARIN LEVEL (UNFRACTIONATED): HEPARIN UNFRACTIONATED: 0.49 [IU]/mL (ref 0.30–0.70)

## 2016-08-09 LAB — PROTIME-INR
INR: 2.48
Prothrombin Time: 27.3 seconds — ABNORMAL HIGH (ref 11.4–15.2)

## 2016-08-09 MED ORDER — WARFARIN SODIUM 2.5 MG PO TABS
2.5000 mg | ORAL_TABLET | Freq: Once | ORAL | Status: AC
  Administered 2016-08-09: 2.5 mg via ORAL
  Filled 2016-08-09: qty 1

## 2016-08-09 MED ORDER — METOPROLOL TARTRATE 50 MG PO TABS
50.0000 mg | ORAL_TABLET | Freq: Two times a day (BID) | ORAL | Status: DC
  Administered 2016-08-09 – 2016-08-10 (×3): 50 mg via ORAL
  Filled 2016-08-09 (×3): qty 1

## 2016-08-09 NOTE — Care Management Important Message (Signed)
Important Message  Patient Details  Name: Jerry Baker MRN: [id number] Date of Birth: 07-Jan-1946   Medicare Important Message Given:  Yes    Kyla BalzarineShealy, Genieve Ramaswamy Abena 08/09/2016, 2:16 PM

## 2016-08-09 NOTE — Progress Notes (Addendum)
Progress Note  Patient Name: Jerry Baker Date of Encounter: 08/09/2016  Primary Cardiologist: Armanda Magic wants pt to continue to f/u with [facility] on D/C  Subjective   No chest pain, dyspnea, palpitations or orthopnea. Pt walked in the hall yesterday without any symptoms.   Inpatient Medications    Scheduled Meds: . clopidogrel  75 mg Oral Daily  . diltiazem  120 mg Oral Daily  . DULoxetine  90 mg Oral Daily  . folic acid  1 mg Oral Daily  . multivitamin with minerals  1 tablet Oral Daily  . thiamine  100 mg Oral Daily  . warfarin  2.5 mg Oral ONCE-1800  . Warfarin - Pharmacist Dosing Inpatient   Does not apply q1800   Continuous Infusions: . heparin 1,350 Units/hr (08/09/16 0558)   PRN Meds: acetaminophen **OR** acetaminophen, promethazine   Vital Signs    Vitals:   08/08/16 1600 08/08/16 1939 08/09/16 0556 08/09/16 0847  BP: 112/75 112/72 (!) 148/78 133/76  Pulse: 62 71 88   Resp: 18 16 17    Temp: 98.5 F (36.9 C) 98.2 F (36.8 C) 98 F (36.7 C)   TempSrc: Oral  Oral   SpO2: 98% 98% 99%   Weight:   188 lb 6.4 oz (85.5 kg)   Height:   5\' 11"  (1.803 m)     Intake/Output Summary (Last 24 hours) at 08/09/16 0944 Last data filed at 08/09/16 0558  Gross per 24 hour  Intake          1244.55 ml  Output              400 ml  Net           844.55 ml   Filed Weights   08/07/16 0446 08/08/16 0412 08/09/16 0556  Weight: 190 lb 14.4 oz (86.6 kg) 189 lb 6.4 oz (85.9 kg) 188 lb 6.4 oz (85.5 kg)    Telemetry    NSR with PVCs' No VT. - Personally Reviewed  ECG    No new tracings  Physical Exam   GEN: No acute distress.   Neck: No JVD Cardiac: RRR, 3/6 systolic murmur left chest/axilla Respiratory: Clear to auscultation bilaterally. GI: Soft, nontender, non-distended  MS: No edema; No deformity. Neuro:  Nonfocal  Psych: Normal affect   Labs    Chemistry Recent Labs Lab 08/07/16 0500 08/08/16 0649 08/09/16 0539  NA 141 141 144  K 4.2 4.4 4.7   CL 99* 105 102  CO2 32 24 32  GLUCOSE 107* 98 100*  BUN 46* 35* 33*  CREATININE 1.88* 1.51* 1.42*  CALCIUM 9.1 9.1 9.4  PROT 6.0* 6.0* 6.3*  ALBUMIN 3.2* 3.3* 3.4*  AST 261* 178* 113*  ALT 933* 729* 587*  ALKPHOS 84 81 89  BILITOT 1.4* 1.4* 1.4*  GFRNONAA 34* 45* 48*  GFRAA 40* 52* 56*  ANIONGAP 10 12 10      Hematology Recent Labs Lab 08/06/16 0359 08/07/16 0500 08/09/16 0539  WBC 6.9 8.2 10.0  RBC 3.84* 3.97* 4.07*  HGB 12.2* 12.3* 13.2  HCT 36.8* 38.7* 40.4  MCV 95.8 97.5 99.3  MCH 31.8 31.0 32.4  MCHC 33.2 31.8 32.7  RDW 13.7 14.0 14.5  PLT 87* 89* 76*    Cardiac Enzymes Recent Labs Lab 08/02/16 2157 08/03/16 0422 08/03/16 0932 08/04/16 0323  TROPONINI 0.23* 0.23* 0.17* 0.19*   No results for input(s): TROPIPOC in the last 168 hours.   BNP Recent Labs Lab 08/03/16 0422  BNP 763.3*  DDimer  Recent Labs Lab 08/03/16 0727  DDIMER 3.62*     Radiology    No results found.  Cardiac Studies   Echocardiogram 08/05/16 Study Conclusions  - Left ventricle: The cavity size was moderately dilated. Wall   thickness was normal. Systolic function was moderately to   severely reduced. The estimated ejection fraction was in the   range of 30% to 35%. Diffuse hypokinesis. Doppler parameters are   consistent with a reversible restrictive pattern, indicative of   decreased left ventricular diastolic compliance and/or increased   left atrial pressure (grade 3 diastolic dysfunction). - Aortic valve: Trileaflet; mildly thickened, mildly calcified   leaflets. There was trivial regurgitation. - Mitral valve: There was severe regurgitation. - Left atrium: The atrium was severely dilated. - Right ventricle: The cavity size was mildly dilated. Wall   thickness was normal. Pacer wire or catheter noted in right   ventricle. Systolic function was moderately reduced. - Pulmonary arteries: Systolic pressure was mildly increased. PA   peak pressure: 35 mm Hg  (S).  Patient Profile     71 y.o. 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 Previous stent placement. Problems started with contrast for right shoulder. Transferred with dyspnea, acute renal failure, troponin mildly elevated history of ETOH abuse with DT risk.  Assessment & Plan   Polymorphic Vtach  -EP was consulted for evaluation of Torsades. QT was normal. ICD function intact. No shocks. Episodes were self limiting. Keep K=>4.0 and Mag >= 2.0.  LFTs were elevated at the time and amiodarone was contraindicated.  -No further VT. SR with frequent PVC's. -On cardizem. No longer on metoprolol (not sure why). -Today K+ 4.7  PAF -CHA2DS2/VAS Stroke Risk Points 3 (vascular disease, age, CHF) On warfarin dosed by pharmacy. INR 2.48 today. -Maintaining sinus rhythm on Cardizem 120 mg daily -Severe MR and dilated LA make rhythm control difficult  CAD -On Plavix -Pt is NPO in consideration of possible cardiac cath today. Will discuss with Dr Royann Shivers. INR 2.48    Ischemic cardiomyopathy -EF  30-35%, diffuse hypokinesis, grade 3 DD, severe MR per echo 08/05/16 -Considering cardiac cath to fully evaluate possible myocardial ischemia and ischemic cardiomyopathy as contributing to altered liver and kidney function. To be considered in the future with resolution of kidney function.   CKD -Nephrology consulted. Noted no IV contrast was given for shoulder CT. Suspected cause of ARF to be ATN from episodic hypotension in face of Atrial fib.  -SCr improving, 1.42 today  Thrombocytopenia -Plt 76K today. INR is therapeutic today. Should be able to stop heparin.   Abnormal LFT's -LFTs trending down. Negative hepatitis panel -GI consulted- likely ischemic hepatitis in association with acute kidney injury, atrial fibrillation and ischemic cardiomyopathy. Negative CT and U/S negative for any evidence of cirrhosis. May need further outpatient work up for underlying liver disease.    Signed, Berton Bon, NP  08/09/2016, 9:44 AM     I have seen and examined the patient along with Berton Bon, NP .  I have reviewed the chart, notes and new data.  I agree with NP's note.  Key new complaints: denies angina and dyspnea. Key examination changes: no overt hypervolemia Key new findings / data: frequent PVCs, couplets, rare 3-beat runs, no sustained arrhythmia  PLAN: He is fully anticoagulated. Although liver and kidney function tests have continued to improve, there are still well outside the normal range. It is premature to consider cardiac catheterization.  His decompensation was  related to ARF/acute hepatitis.  Arrhythmia is less severe than at presentation. Acknowledge concern for ischemia. Cath would be a superior way to evaluate, but he is not ready for that. Per Dr. Eden EmmsNishan, he was supposed to have a nuclear perfusion study, but this was not ordered. Will schedule for tomorrow.  Metoprolol held (inadvertently?) at transfer. Will restart it.  Thurmon FairMihai Shomari Scicchitano, MD, [facility] [phone] 08/09/2016, 1:01 PM

## 2016-08-09 NOTE — Progress Notes (Signed)
PROGRESS NOTE    Jerry Baker  ZOX:[id number] DOB: [birth year] DOA: 08/02/2016 PCP: System, Pcp Not In    Brief Narrative: Jerry Baker a 71 y.o.malewith history of CAD status post stenting, cardiomyopathy last EF measured was 40% with AICD and pacemaker placement, peripheral neuropathy presented to the ER at [facility] with complaints of shortness of breath. He was found to be in AFib with RVR, acute renal failure and ischemic hepatopathy. Cardiology has been following, rate has improved and renal function is recovering. Nephrology was consulted and signed off. LFTs are trending downward, and GI recommended outpatient follow up. Due to renal impairment, coumadin was started with a heparin bridge from 5/8 - 5/13.   He had an episode of polymorphic ventricular tachycardia on 5/10 which terminated prior to ICD shock delivery. EP was consulted and recommended ischemic work up. Due to elevated creatinine, catheterization is deferred and plan is for nuclear perfusion study 5/15.   Assessment & Plan:   Principal Problem:   Atrial fibrillation with RVR (HCC) Active Problems:   Cardiomyopathy (HCC)   Diverticulitis   Peripheral neuropathy   Coronary artery disease involving native coronary artery of native heart without angina pectoris   AKI (acute kidney injury) (HCC)   Elevated troponin  Atrial fibrillation with RVR and episodes of Torsades on 5/10: Maintaining NSR currently. AICD interrogation showed nonsustained polymorphic VTach which spontaneously terminated < 1 sec prior to shock from AICD.  - EP consulted, recommending ischemic work up. Nuclear perfusion study 5/15.  - Keep K > 4 and Mg > 2 - Continue telemetry - Heparin bridge to coumadin completed 5/13. Coumadin per pharmacy. Will need INR checks set up as outpatient. CM consulted. Wonder if NOAC would eventually be more practical anticoagulation when/if renal function improves.   - Continue cardizem  Cardiomyopathy and  acute on chronic systolic CHF: EF 81-19% - Per cardiology. Troponin elevation thought to be due to demand ischemia.  - Holding lasix since 5/11.   Acute kidney injury: Due to ATN from hypotension due to AFib. Improving. - Holding lasix and monitor per nephrology recommendation.   - Avoid nephrotoxins - [facility]  Alcohol abuse: CIWA scores have consistently remained zero.  - DC'ed CIWA.  - Continue MVM's - Takes vitamin B12 for associated neuropathy. Level is extremely elevated at 5,493 so this will be held.   Elevated liver function tests: US abdomen shows some gall bladder sludge and CBD at 0.9 cm, no stones or evidence of obstruction. - GI consulted, suspect due to shock liver/ischemic hepatopathy, since improving. Precipitous improvement not consistent with statin-induced hepatopathy - Will need close follow up as outpatient - Monitor, improving.  Acute mild pancreatitis: Resolved, lipase levels wnl. No abd pain.  - Tolerating diet, resolved.   Mild anemia: AOCD, mild, stable. Elevated LDH likely not indicative of hemolysis.  - Monitor  Acute thrombocytopenia: Heparin and antiplatelets still ok if plt >50k per oncology.  - Monitor. Dr. Bertis Ruddy has evaluated patient.   DVT prophylaxis: Coumadin.  Code Status: Full Family Communication: None at bedside Disposition Plan: Discharge home pending results of nuclear study 5/15.   Consultants:   Cardiology  Nephrology, Dr. Briant Cedar  Gastroenterology, Dr. Matthias Hughs, Dr. Levora Angel.   Procedures:  Echocardiogram 08/05/2016: - Left ventricle: The cavity size was moderately dilated. Wall   thickness was normal. Systolic function was moderately to   severely reduced. The estimated ejection fraction was in the   range of 30% to 35%. Diffuse hypokinesis.  Doppler parameters are   consistent with a reversible restrictive pattern, indicative of   decreased left ventricular diastolic compliance and/or increased   left  atrial pressure (grade 3 diastolic dysfunction). - Aortic valve: Trileaflet; mildly thickened, mildly calcified   leaflets. There was trivial regurgitation. - Mitral valve: There was severe regurgitation. - Left atrium: The atrium was severely dilated. - Right ventricle: The cavity size was mildly dilated. Wall   thickness was normal. Pacer wire or catheter noted in right   ventricle. Systolic function was moderately reduced. - Pulmonary arteries: Systolic pressure was mildly increased. PA   peak pressure: 35 mm Hg (S).  U/S abdomen 08/04/2016: Small amount of gallbladder sludge. Negative for stones or cholecystitis. Negative for intrahepatic biliary ductal dilatation. The distal common bile duct measures 0.9 cm but no stone or other evidence of obstruction is seen.   Antimicrobials: none   Subjective: At functional baseline. No events overnight. No chest pain or dyspnea.  Objective: Vitals:   08/08/16 1600 08/08/16 1939 08/09/16 0556 08/09/16 0847  BP: 112/75 112/72 (!) 148/78 133/76  Pulse: 62 71 88   Resp: 18 16 17    Temp: 98.5 F (36.9 C) 98.2 F (36.8 C) 98 F (36.7 C)   TempSrc: Oral  Oral   SpO2: 98% 98% 99%   Weight:   85.5 kg (188 lb 6.4 oz)   Height:   5\' 11"  (1.803 m)     Intake/Output Summary (Last 24 hours) at 08/09/16 1428 Last data filed at 08/09/16 1236  Gross per 24 hour  Intake          1144.55 ml  Output              700 ml  Net           444.55 ml   Filed Weights   08/07/16 0446 08/08/16 0412 08/09/16 0556  Weight: 86.6 kg (190 lb 14.4 oz) 85.9 kg (189 lb 6.4 oz) 85.5 kg (188 lb 6.4 oz)   Examination: General exam: 70yo M in no distress Respiratory system: Clear to auscultation. Respiratory effort normal. Cardiovascular system: S1 & S2 heard, RRR. No JVD, murmurs, rubs, gallops or clicks.  Gastrointestinal system: Abdomen is soft, nontender and nondistended. No organomegaly or masses felt. Normal bowel sounds heard. Central nervous system:  Alert and oriented. No focal neurological deficits. Gait wnl. Extremities: No edema or deformities. Skin: No rashes, lesions or ulcers. Nontender diffuse ecchymosis on left volar forearm, resolving. Psychiatry: Judgement and insight appear normal. Euthymic mood, congruent affect.    Data Reviewed: I have personally reviewed following labs and imaging studies  CBC:  Recent Labs Lab 08/02/16 2157  08/04/16 0231 08/05/16 0324 08/06/16 0359 08/07/16 0500 08/09/16 0539  WBC 10.4  < > 11.8* 8.9 6.9 8.2 10.0  NEUTROABS 7.4  --   --   --   --   --   --   HGB 12.9*  < > 12.3* 11.8* 12.2* 12.3* 13.2  HCT 38.8*  < > 37.7* 36.6* 36.8* 38.7* 40.4  MCV 95.8  < > 96.7 95.6 95.8 97.5 99.3  PLT 108*  < > 118* 87* 87* 89* 76*  < > = values in this interval not displayed. Basic Metabolic Panel:  Recent Labs Lab 08/03/16 0422 08/04/16 0231 08/05/16 0324 08/05/16 1839 08/06/16 0359 08/06/16 1455 08/07/16 0500 08/08/16 0649 08/09/16 0539  NA 137 136  136 138  137  --  138 138 141 141 144  K 5.5* 4.8  4.7 3.2*  3.4* 3.2* 3.4* 3.9 4.2 4.4 4.7  CL 106 103  103 100*  101  --  95* 98* 99* 105 102  CO2 20* 21*  20* 26  21*  --  32 30 32 24 32  GLUCOSE 153* 118*  119* 93  84  --  100* 153* 107* 98 100*  BUN 61* 70*  70* 69*  70*  --  60* 55* 46* 35* 33*  CREATININE 3.35* 3.75*  3.86* 2.89*  2.84*  --  2.43* 2.12* 1.88* 1.51* 1.42*  CALCIUM 8.7* 8.3*  8.4* 8.4*  8.4*  --  8.7* 9.0 9.1 9.1 9.4  MG 2.4  --   --  2.1  --   --   --   --   --   PHOS  --  7.3* 5.1*  --   --   --   --   --   --    GFR: Estimated Creatinine Clearance: 50.8 mL/min (A) (by C-G formula based on SCr of 1.42 mg/dL (H)). Liver Function Tests:  Recent Labs Lab 08/05/16 0324 08/06/16 0359 08/07/16 0500 08/08/16 0649 08/09/16 0539  AST 738* 483* 261* 178* 113*  ALT 1,327* 1,220* 933* 729* 587*  ALKPHOS 84 89 84 81 89  BILITOT 1.5* 1.7* 1.4* 1.4* 1.4*  PROT 5.9* 6.1* 6.0* 6.0* 6.3*  ALBUMIN 3.5  3.5  3.5 3.2* 3.3* 3.4*    Recent Labs Lab 08/03/16 0539  LIPASE 21   Coagulation Profile:  Recent Labs Lab 08/05/16 0324 08/06/16 0359 08/07/16 0500 08/08/16 0649 08/09/16 0539  INR 1.56 1.45 1.55 2.05 2.48   Cardiac Enzymes:  Recent Labs Lab 08/02/16 2157 08/03/16 0422 08/03/16 0932 08/04/16 0323  TROPONINI 0.23* 0.23* 0.17* 0.19*   Sepsis Labs:  Recent Labs Lab 08/03/16 0422  LATICACIDVEN 1.9    Recent Results (from the past 240 hour(s))  MRSA PCR Screening     Status: None   Collection Time: 08/07/16  8:31 PM  Result Value Ref Range Status   MRSA by PCR NEGATIVE NEGATIVE Final    Comment:        The GeneXpert MRSA Assay (FDA approved for NASAL specimens only), is one component of a comprehensive MRSA colonization surveillance program. It is not intended to diagnose MRSA infection nor to guide or monitor treatment for MRSA infections.      Radiology Studies: No results found.  Scheduled Meds: . clopidogrel  75 mg Oral Daily  . diltiazem  120 mg Oral Daily  . DULoxetine  90 mg Oral Daily  . folic acid  1 mg Oral Daily  . metoprolol tartrate  50 mg Oral BID  . multivitamin with minerals  1 tablet Oral Daily  . thiamine  100 mg Oral Daily  . warfarin  2.5 mg Oral ONCE-1800  . Warfarin - Pharmacist Dosing Inpatient   Does not apply q1800   Continuous Infusions:   LOS: 7 days   Time spent: 25 minutes.   Hazeline Junker, MD [facility] Pager [phone]   If 7PM-7AM, please contact night-coverage [email or web address] Password [email or web address] 08/09/2016, 2:28 PM

## 2016-08-09 NOTE — Progress Notes (Addendum)
ANTICOAGULATION CONSULT NOTE Pharmacy Consult for Heparin and Coumadin Indication: atrial fibrillation  Allergies  Allergen Reactions  . Penicillins Other (See Comments)    Unknown    Patient Measurements: Height: 5\' 11"  (180.3 cm) Weight: 188 lb 6.4 oz (85.5 kg) IBW/kg (Calculated) : 75.3 kg   Vital Signs: Temp: 98 F (36.7 C) (05/14 0556) Temp Source: Oral (05/14 0556) BP: 148/78 (05/14 0556) Pulse Rate: 88 (05/14 0556)  Labs:  Recent Labs  08/07/16 0500 08/07/16 1845 08/08/16 0649 08/09/16 0539  HGB 12.3*  --   --  13.2  HCT 38.7*  --   --  40.4  PLT 89*  --   --  76*  LABPROT 18.7*  --  23.4* 27.3*  INR 1.55  --  2.05 2.48  HEPARINUNFRC 0.24* 0.43 0.30 0.49  CREATININE 1.88*  --  1.51* 1.42*    Estimated Creatinine Clearance: 50.8 mL/min (A) (by C-G formula based on SCr of 1.42 mg/dL (H)).   Assessment: 71 y.[facility] with new onset Afib RVR and AKI. Pharmacy consulted to manage heparin bridge to Coumadin. No oral anticoagulation PTA.   Heparin level is therapeutic at 0.49. INR is therapeutic at 2.48 today. Need to watch closely with liver function. H&H remains stable and plt low/stable. No overt s/sx of bleeding noted. Plan is for cath as renal function and LFTs improve - no date as of yet.  Goal of Therapy:  INR 2-3 Heparin level 0.3-0.7 units/ml Monitor platelets by anticoagulation protocol: Yes   Plan:  Cardiology please advise - d/c heparin for INR >2 OR continue if cath planned soon in which case Coumadin needs to be held? Continue heparin drip at 1350 units/hr  Coumadin 2.5 mg PO tonight Daily heparin level, INR and CBC Monitor for s/sx bleeding    Loura BackJennifer [place], PharmD, BCPS Clinical Pharmacist Phone for today [phone]- [phone] Main pharmacy - [phone] 08/09/2016 8:23 AM    Addendum: Discontinued heparin drip for INR >2. If cath planned soon, consider consulting pharmacy to resume heparin drip when INR  <2.  Loura BackJennifer [place], PharmD, BCPS Clinical Pharmacist 08/09/2016 10:13 AM

## 2016-08-10 ENCOUNTER — Inpatient Hospital Stay (HOSPITAL_COMMUNITY): Payer: Non-veteran care

## 2016-08-10 DIAGNOSIS — I472 Ventricular tachycardia: Secondary | ICD-10-CM

## 2016-08-10 LAB — COMPREHENSIVE METABOLIC PANEL
ALK PHOS: 89 U/L (ref 38–126)
ALT: 446 U/L — AB (ref 17–63)
ANION GAP: 8 (ref 5–15)
AST: 77 U/L — AB (ref 15–41)
Albumin: 3.5 g/dL (ref 3.5–5.0)
BILIRUBIN TOTAL: 1.4 mg/dL — AB (ref 0.3–1.2)
BUN: 30 mg/dL — ABNORMAL HIGH (ref 6–20)
CO2: 29 mmol/L (ref 22–32)
Calcium: 9.4 mg/dL (ref 8.9–10.3)
Chloride: 104 mmol/L (ref 101–111)
Creatinine, Ser: 1.3 mg/dL — ABNORMAL HIGH (ref 0.61–1.24)
GFR, EST NON AFRICAN AMERICAN: 54 mL/min — AB (ref >60)
GLUCOSE: 104 mg/dL — AB (ref 65–99)
POTASSIUM: 5.1 mmol/L (ref 3.5–5.1)
Sodium: 141 mmol/L (ref 135–145)
Total Protein: 6.3 g/dL — ABNORMAL LOW (ref 6.5–8.1)

## 2016-08-10 LAB — NM MYOCAR MULTI W/SPECT W/WALL MOTION / EF
CHL CUP NUCLEAR SRS: 26
CSEPEDS: 0 s
CSEPHR: 55 %
CSEPPHR: 82 {beats}/min
Estimated workload: 1 METS
Exercise duration (min): 0 min
LV dias vol: 319 mL (ref 62–150)
LVSYSVOL: 228 mL
MPHR: 149 {beats}/min
NUC STRESS TID: 1.2
RATE: 0.4
Rest HR: 74 {beats}/min
SDS: 1
SSS: 27

## 2016-08-10 LAB — CBC
HEMATOCRIT: 41.6 % (ref 39.0–52.0)
HEMOGLOBIN: 13.3 g/dL (ref 13.0–17.0)
MCH: 31.7 pg (ref 26.0–34.0)
MCHC: 32 g/dL (ref 30.0–36.0)
MCV: 99.3 fL (ref 78.0–100.0)
PLATELETS: 89 10*3/uL — AB (ref 150–400)
RBC: 4.19 MIL/uL — AB (ref 4.22–5.81)
RDW: 14.5 % (ref 11.5–15.5)
WBC: 9.1 10*3/uL (ref 4.0–10.5)

## 2016-08-10 LAB — PROTIME-INR
INR: 2.56
Prothrombin Time: 28 seconds — ABNORMAL HIGH (ref 11.4–15.2)

## 2016-08-10 MED ORDER — REGADENOSON 0.4 MG/5ML IV SOLN
INTRAVENOUS | Status: DC
Start: 2016-08-10 — End: 2016-08-10
  Filled 2016-08-10: qty 5

## 2016-08-10 MED ORDER — WARFARIN SODIUM 5 MG PO TABS: 5.0000 mg | ORAL_TABLET | Freq: Once | ORAL | Status: DC

## 2016-08-10 MED ORDER — REGADENOSON 0.4 MG/5ML IV SOLN
0.4000 mg | Freq: Once | INTRAVENOUS | Status: AC
  Administered 2016-08-10: 0.4 mg via INTRAVENOUS

## 2016-08-10 MED ORDER — TECHNETIUM TC 99M TETROFOSMIN IV KIT: 10.0000 | PACK | Freq: Once | INTRAVENOUS | Status: DC

## 2016-08-10 MED ORDER — METOPROLOL TARTRATE 50 MG PO TABS: 50.0000 mg | ORAL_TABLET | Freq: Two times a day (BID) | ORAL | 0 refills | Status: AC

## 2016-08-10 MED ORDER — APIXABAN 5 MG PO TABS: 5.0000 mg | ORAL_TABLET | Freq: Two times a day (BID) | ORAL | Status: DC

## 2016-08-10 MED ORDER — APIXABAN 5 MG PO TABS: 5.0000 mg | ORAL_TABLET | Freq: Two times a day (BID) | ORAL | 0 refills | Status: AC

## 2016-08-10 NOTE — Progress Notes (Signed)
Duplicate, error

## 2016-08-10 NOTE — Discharge Summary (Signed)
Physician Discharge Summary  Deryck Hippler ZOX:[id number] DOB: 1945-10-02 DOA: 08/02/2016  PCP: System, Pcp Not In  Admit date: 08/02/2016 Discharge date: 08/10/2016  Admitted From: Home Disposition: Home   Recommendations for Outpatient Follow-up:  1. Follow up with PCP 08/19/2016: Recheck renal function and continue anticoagulation as indicated for NV AFib.  2. Monitor creatinine and LFTs with CMP at follow up. 1. ACEi/ARB to be started at follow up visit if able, pending BP, Cr, K. 2. Consider restarting statin once LFTs normalize.  3. Monitor chronic thrombocytopenia and anemia with CBC. 4. Continue risk factor optimization as you are. Nuclear stress test was high risk due to fixed wall motion abnormalities without reversible ischemia.  ACEi/ARB to be started at follow up visit. Clinically euvolemic, does not need daily diuretic at this time.  Home Health: None Equipment/Devices: None Discharge Condition: Stable CODE STATUS: Full Diet recommendation: Heart healthy  Brief/Interim Summary: [name] a 71 y.o.malewith history of CAD status post stenting, cardiomyopathy last EF measured was 40% with AICD and pacemaker placement, peripheral neuropathy presented to the ER at [facility] with complaints of shortness of breath. He was found to be in AFib with RVR, acute renal failure and ischemic hepatopathy. Cardiology has been following, rate has improved and renal function is recovering. Nephrology was consulted and signed off. LFTs are trending downward, and GI recommended outpatient follow up. Due to renal impairment, coumadin was started with a heparin bridge from 5/8 - 5/13.   He had an episode of polymorphic ventricular tachycardia on 5/10 which terminated prior to ICD shock delivery. EP was consulted and recommended ischemic work up. Due to elevated creatinine, catheterization was deferred and instead had nuclear perfusion study 5/15. This showed fixed defects previously  known without reversible ischemia.   Discharge Diagnoses:  Principal Problem:   Atrial fibrillation with RVR (HCC) Active Problems:   Cardiomyopathy (HCC)   Diverticulitis   Peripheral neuropathy   Coronary artery disease involving native coronary artery of native heart without angina pectoris   AKI (acute kidney injury) (HCC)   Elevated troponin  Nonvalvular atrial fibrillation with RVR and episodes of Torsades on 5/10: Maintaining NSR currently. AICD interrogation showed nonsustained polymorphic VTach which spontaneously terminated < 1 sec prior to shock from [facility] (properly functioning).  - EP consulted, recommended ischemic work up. Nuclear perfusion study 5/15 showed no reversible ischemia.  - Due to AKI, coumadin was started for anticoagulation, though with improvements in Cr and LFTs, eliquis was prescribed at discharge. 30-day card provided by CM, teaching by pharmacy, prescription given prior to DC.  - Started metoprolol as below.   Cardiomyopathy and acute on chronic systolic CHF: EF 72-19% - Per cardiology. Troponin elevation thought to be due to demand ischemia.  - Holding lasix since 5/11. Remains euvolemic, no daily diuretic required.  Acute kidney injury: Due to ATN from hypotension due to AFib. Improving. Nephrology evaluated the patient.  - [facility]  Alcohol abuse: CIWA scores have consistently remained zero.  - DC'ed CIWA.  - Abstinence counseling provided - Takes vitamin B12 for associated neuropathy. Level is extremely elevated at 5,493 so this was held.   Elevated liver function tests: US abdomen shows some gall bladder sludge and CBD at 0.9 cm, no stones or evidence of obstruction. GI consulted, suspect due to shock liver/ischemic hepatopathy, since improving. Precipitous improvement not consistent with statin-induced hepatopathy. - Will need close follow up as outpatient; consider restarting statin once LFTs normalize.   Acute  mild  pancreatitis: Resolved, lipase levels wnl. No abd pain.  - Tolerating diet, resolved.   Mild anemia: AOCD, mild, stable. Elevated LDH likely not indicative of hemolysis.  - Monitor  Acute thrombocytopenia: Anticoagulation still ok if plt >50k per hematology/oncology.  - Monitor. Dr. Bertis Ruddy has evaluated patient.   Discharge Instructions Discharge Instructions    (HEART FAILURE PATIENTS) Call MD:  Anytime you have any of the following symptoms: 1) 3 pound weight gain in 24 hours or 5 pounds in 1 week 2) shortness of breath, with or without a dry hacking cough 3) swelling in the hands, feet or stomach 4) if you have to sleep on extra pillows at night in order to breathe.    Complete by:  As directed    Diet - low sodium heart healthy    Complete by:  As directed    Diet - low sodium heart healthy    Complete by:  As directed    Discharge instructions    Complete by:  As directed    You have been diagnosed with congestive heart failure and had a high risk nuclear stress test that requires a cardiac catheterization. You will need to change medications as below and follow up at the [place] to have labs rechecked prior to a cardiac catheterization.  - Start taking metoprolol and eliquis as below.  - Maintain a low salt diet.  - Follow up as below, but if you experience any trouble breathing, chest pain or palpitations, you need to seek medical attention right away.   Increase activity slowly    Complete by:  As directed      Allergies as of 08/10/2016      Reactions   Penicillins Other (See Comments)   Unknown      Medication List    STOP taking these medications   aspirin EC 81 MG tablet     TAKE these medications   apixaban 5 MG Tabs tablet Commonly known as:  ELIQUIS STARTER PACK Take 1 tablet (5 mg total) by mouth 2 (two) times daily.   ASPERCREME LIDOCAINE EX Apply 1 application topically as needed (for pain).   clopidogrel 75 MG tablet Commonly known as:  PLAVIX Take 75  mg by mouth daily.   DULoxetine 30 MG capsule Commonly known as:  CYMBALTA Take 90 mg by mouth daily.   metoprolol tartrate 50 MG tablet Commonly known as:  LOPRESSOR Take 1 tablet (50 mg total) by mouth 2 (two) times daily.   VITAMIN B 12 PO Take 100 mcg by mouth daily.      Follow-up Information    [facility] Follow up on 08/19/2016.   Why:  @ 2:30 pm with Dr. Vevelyn Pat for Hospital Follow Up Appointment.  Contact information: [address] [place], [place] [address] [phone]         Allergies  Allergen Reactions  . Penicillins Other (See Comments)    Unknown    Consultations:  Cardiology  Nephrology, Dr. Briant Cedar  Gastroenterology, Dr. Matthias Hughs, Dr. Levora Angel.   Procedures/Studies: US Abdomen Complete  Result Date: 08/04/2016 CLINICAL DATA:  Elevated liver function tests. EXAM: ABDOMEN ULTRASOUND COMPLETE COMPARISON:  CT abdomen and pelvis 08/03/2016. FINDINGS: Gallbladder: No gallstones or wall thickening visualized. Small amount of gallbladder sludge is identified. No sonographic Murphy sign noted by sonographer. Common bile duct: Diameter: Proximal common bile duct measures 0.4 cm. Distally, the duct measures 0.9 cm. Liver: No focal lesion identified. Within normal limits in  parenchymal echogenicity. IVC: No abnormality visualized. Pancreas: Visualized portion unremarkable. Spleen: Size and appearance within normal limits. Right Kidney: Length: 10.2 cm. Echogenicity within normal limits. 0.9 cm cyst upper pole noted. No hydronephrosis visualized. Left Kidney: Length: 10.3 cm. Echogenicity within normal limits. No mass or hydronephrosis visualized. Abdominal aorta: No aneurysm visualized. Other findings: Right pleural effusion is identified. IMPRESSION: Small amount of gallbladder sludge. Negative for stones or cholecystitis. Negative for intrahepatic biliary ductal dilatation. The distal common bile duct measures 0.9 cm but no stone or other  evidence of obstruction is seen. Electronically Signed   By: Drusilla Kanner M.D.   On: 08/04/2016 12:26   Nm Myocar Multi W/spect W/wall Motion / Ef  Result Date: 08/10/2016  There was no ST segment deviation noted during stress.  Defect 1: There is a large defect of severe severity present in the basal inferior, mid inferior, apical septal, apical inferior and apex location.  This is a high risk study.  The left ventricular ejection fraction is severely decreased (<30%).  High risk stress nuclear study with prior inferior, septal and apical infarct; no ischemia; EF 28 with global hypokinesis and akinesis of there inferior, apical and septal walls; mild LVE; study high risk due to reduced LV function.   Dg Chest Port 1 View  Result Date: 08/04/2016 CLINICAL DATA:  Respiratory crackles in both lungs. Increasing shortness of breath tonight. EXAM: PORTABLE CHEST 1 VIEW COMPARISON:  Radiograph yesterday. FINDINGS: Single lead left-sided pacemaker in place. Cardiomegaly is unchanged. Increasing hazy opacity at the right lung base may be pleural effusion or airspace disease. Increasing pulmonary edema in Kerley B-lines. No pneumothorax. Stable osseous structures. IMPRESSION: Increasing pulmonary edema. Progressive hazy opacity at the right lung base may be increased pleural fluid or airspace disease, atelectasis or pneumonia. Electronically Signed   By: Rubye Oaks M.D.   On: 08/04/2016 01:36   Dg Chest Port 1 View  Result Date: 08/03/2016 CLINICAL DATA:  Shortness of breath, history of CHF, cardiomyopathy, coronary artery disease, chronic renal insufficiency. EXAM: PORTABLE CHEST 1 VIEW COMPARISON:  None in PACs FINDINGS: The lungs are well-expanded. The interstitial markings are mildly prominent especially at the right lung base. The cardiac silhouette is enlarged. The pulmonary vascularity mildly engorged. The ICD is in stable position. There is calcification in the wall of the aortic arch. The  bony thorax exhibits no acute abnormality. IMPRESSION: CHF with mild pulmonary interstitial edema. Minimal right infrahilar subsegmental atelectasis is suspected. Electronically Signed   By: David  [place] M.D.   On: 08/03/2016 07:42   Ct Renal Stone Study  Result Date: 08/03/2016 CLINICAL DATA:  71 y/o M; right flank pain. History of chronic kidney disease, appendectomy, and partial colectomy. EXAM: CT ABDOMEN AND PELVIS WITHOUT CONTRAST TECHNIQUE: Multidetector CT imaging of the abdomen and pelvis was performed following the standard protocol without IV contrast. COMPARISON:  None. FINDINGS: Lower chest: AICD lead in the right ventricle. Severe coronary artery calcification. Normal heart size. No pericardial effusion. Small right greater than left pleural effusions. Hepatobiliary: No focal liver abnormality is seen. No gallstones, gallbladder wall thickening, or biliary dilatation. Pancreas: There is indistinctness of fat surrounding the pancreatic and retroperitoneal edema greater on the right. Spleen: Normal in size without focal abnormality. Adrenals/Urinary Tract: Adrenal glands are unremarkable. Kidneys are normal, without renal calculi, focal lesion, or hydronephrosis. Bladder is unremarkable. Stomach/Bowel: Stomach is within normal limits. Appendix appears normal. No evidence of bowel wall thickening, distention, or inflammatory changes. Few sigmoid diverticular present.  Colorectal anastomosis is patent without obstruction. Vascular/Lymphatic: Aortic atherosclerosis. No enlarged abdominal or pelvic lymph nodes. Reproductive: Prostate is unremarkable. Other: No abdominal wall hernia or abnormality. No abdominopelvic ascites. Musculoskeletal: Moderate spondylosis of the visible thoracolumbar spine. No acute osseous abnormality is evident. IMPRESSION: 1. Indistinctness of pancreatic head and mild nonspecific retroperitoneal edema may represent acute pancreatitis. Correlation with lipase recommended. 2. No  hydronephrosis or urinary stone disease. 3. Mild sigmoid diverticulosis. No obstructive or inflammatory changes of bowel identified. 4. Small bilateral pleural effusions. Electronically Signed   By: Mitzi HansenLance  Furusawa-Stratton M.D.   On: 08/03/2016 03:02   Echocardiogram 08/05/2016: - Left ventricle: The cavity size was moderately dilated. Wall thickness was normal. Systolic function was moderately to severely reduced. The estimated ejection fraction was in the range of 30% to 35%. Diffuse hypokinesis. Doppler parameters are consistent with a reversible restrictive pattern, indicative of decreased left ventricular diastolic compliance and/or increased left atrial pressure (grade 3 diastolic dysfunction). - Aortic valve: Trileaflet; mildly thickened, mildly calcified leaflets. There was trivial regurgitation. - Mitral valve: There was severe regurgitation. - Left atrium: The atrium was severely dilated. - Right ventricle: The cavity size was mildly dilated. Wall thickness was normal. Pacer wire or catheter noted in right ventricle. Systolic function was moderately reduced. - Pulmonary arteries: Systolic pressure was mildly increased. PA peak pressure: 35 mm Hg (S).  U/S abdomen 08/04/2016: Small amount of gallbladder sludge. Negative for stones or cholecystitis. Negative for intrahepatic biliary ductal dilatation. The distal common bile duct measures 0.9 cm but no stone or other evidence of obstruction is seen.   Subjective: No complaints, no chest pain, angina, palpitations, abd pain, leg swelling, orthopnea.   Discharge Exam: BP 113/76 (BP Location: Right Arm)   Pulse 72   Temp 98.2 F (36.8 C) (Oral)   Resp 17   Ht 5\' 11"  (1.803 m)   Wt 85.2 kg (187 lb 12.8 oz)   SpO2 100%   BMI 26.19 kg/m   General: Pt is alert, awake, not in acute distress Cardiovascular: RRR, S1/S2 +, no rubs, no gallops Respiratory: CTA bilaterally, no wheezing, no rhonchi Abdominal:  Soft, NT, ND, bowel sounds + Extremities: No edema, no cyanosis  The results of significant diagnostics from this hospitalization (including imaging, microbiology, ancillary and laboratory) are listed below for reference.    Labs: BNP (last 3 results)  Recent Labs  08/03/16 0422  BNP 763.3*   Basic Metabolic Panel:  Recent Labs Lab 08/04/16 0231 08/05/16 0324 08/05/16 1839  08/06/16 1455 08/07/16 0500 08/08/16 0649 08/09/16 0539 08/10/16 0543  NA 136  136 138  137  --   < > 138 141 141 144 141  K 4.8  4.7 3.2*  3.4* 3.2*  < > 3.9 4.2 4.4 4.7 5.1  CL 103  103 100*  101  --   < > 98* 99* 105 102 104  CO2 21*  20* 26  21*  --   < > 30 32 24 32 29  GLUCOSE 118*  119* 93  84  --   < > 153* 107* 98 100* 104*  BUN 70*  70* 69*  70*  --   < > 55* 46* 35* 33* 30*  CREATININE 3.75*  3.86* 2.89*  2.84*  --   < > 2.12* 1.88* 1.51* 1.42* 1.30*  CALCIUM 8.3*  8.4* 8.4*  8.4*  --   < > 9.0 9.1 9.1 9.4 9.4  MG  --   --  2.1  --   --   --   --   --   --  PHOS 7.3* 5.1*  --   --   --   --   --   --   --   < > = values in this interval not displayed. Liver Function Tests:  Recent Labs Lab 08/06/16 0359 08/07/16 0500 08/08/16 0649 08/09/16 0539 08/10/16 0543  AST 483* 261* 178* 113* 77*  ALT 1,220* 933* 729* 587* 446*  ALKPHOS 89 84 81 89 89  BILITOT 1.7* 1.4* 1.4* 1.4* 1.4*  PROT 6.1* 6.0* 6.0* 6.3* 6.3*  ALBUMIN 3.5 3.2* 3.3* 3.4* 3.5   No results for input(s): LIPASE, AMYLASE in the last 168 hours. No results for input(s): AMMONIA in the last 168 hours. CBC:  Recent Labs Lab 08/05/16 0324 08/06/16 0359 08/07/16 0500 08/09/16 0539 08/10/16 0543  WBC 8.9 6.9 8.2 10.0 9.1  HGB 11.8* 12.2* 12.3* 13.2 13.3  HCT 36.6* 36.8* 38.7* 40.4 41.6  MCV 95.6 95.8 97.5 99.3 99.3  PLT 87* 87* 89* 76* 89*   Cardiac Enzymes:  Recent Labs Lab 08/04/16 0323  TROPONINI 0.19*   BNP: Invalid input(s): POCBNP CBG: No results for input(s): GLUCAP in the last 168  hours. D-Dimer No results for input(s): DDIMER in the last 72 hours. Hgb A1c No results for input(s): HGBA1C in the last 72 hours. Lipid Profile No results for input(s): CHOL, HDL, LDLCALC, TRIG, CHOLHDL, LDLDIRECT in the last 72 hours. Thyroid function studies No results for input(s): TSH, T4TOTAL, T3FREE, THYROIDAB in the last 72 hours.  Invalid input(s): FREET3 Anemia work up No results for input(s): VITAMINB12, FOLATE, FERRITIN, TIBC, IRON, RETICCTPCT in the last 72 hours. Urinalysis    Component Value Date/Time   COLORURINE AMBER (A) 08/02/2016 2340   APPEARANCEUR CLOUDY (A) 08/02/2016 2340   LABSPEC 1.025 08/02/2016 2340   PHURINE 5.0 08/02/2016 2340   GLUCOSEU NEGATIVE 08/02/2016 2340   HGBUR NEGATIVE 08/02/2016 2340   BILIRUBINUR NEGATIVE 08/02/2016 2340   KETONESUR 5 (A) 08/02/2016 2340   PROTEINUR 100 (A) 08/02/2016 2340   NITRITE NEGATIVE 08/02/2016 2340   LEUKOCYTESUR NEGATIVE 08/02/2016 2340    Microbiology Recent Results (from the past 240 hour(s))  MRSA PCR Screening     Status: None   Collection Time: 08/07/16  8:31 PM  Result Value Ref Range Status   MRSA by PCR NEGATIVE NEGATIVE Final    Comment:        The GeneXpert MRSA Assay (FDA approved for NASAL specimens only), is one component of a comprehensive MRSA colonization surveillance program. It is not intended to diagnose MRSA infection nor to guide or monitor treatment for MRSA infections.     Time coordinating discharge: Approximately 40 minutes  Hazeline Junker, MD  [facility] 08/10/2016, 6:28 PM Pager [phone]  If 7PM-7AM, please contact night-coverage [email or web address] Password [email or web address]

## 2016-08-10 NOTE — Progress Notes (Signed)
ANTICOAGULATION CONSULT NOTE - Initial Consult  Pharmacy Consult for Eliquis Indication: atrial fibrillation  Allergies  Allergen Reactions  . Penicillins Other (See Comments)    Unknown   Patient Measurements: Height: 5\' 11"  (180.3 cm) Weight: 187 lb 12.8 oz (85.2 kg) IBW/kg (Calculated) : 75.3  Assessment: 71 y.o male [facility] with new onset Afib RVR and AKI. Started on Coumadin and INR therapeutic but now to switch to Eliquis at discharge since renal function improving. INR today is 2.56, SCr down to 1.3, CrCL ~5055ml/min. Case management aware with plan to get 30 day card.  Goal of Therapy:  Monitor platelets by anticoagulation protocol: Yes   Plan:  Stop Coumadin Start Eliquis 5mg  PO BID tomorrow Monitor CBC, s/s of bleed  Jerry Baker, PharmD, BCPS Clinical Pharmacist Pager [phone] 08/10/2016 4:16 PM

## 2016-08-10 NOTE — Progress Notes (Signed)
ANTICOAGULATION CONSULT NOTE Pharmacy Consult for Coumadin Indication: atrial fibrillation  Allergies  Allergen Reactions  . Penicillins Other (See Comments)    Unknown    Patient Measurements: Height: 5\' 11"  (180.3 cm) Weight: 187 lb 12.8 oz (85.2 kg) IBW/kg (Calculated) : 75.3 kg   Vital Signs:    Labs:  Recent Labs  08/07/16 1845 08/08/16 0649 08/09/16 0539 08/10/16 0543 08/10/16 0852  HGB  --   --  13.2 13.3  --   HCT  --   --  40.4 41.6  --   PLT  --   --  76* 89*  --   LABPROT  --  23.4* 27.3*  --  28.0*  INR  --  2.05 2.48  --  2.56  HEPARINUNFRC 0.43 0.30 0.49  --   --   CREATININE  --  1.51* 1.42* 1.30*  --     Estimated Creatinine Clearance: 55.5 mL/min (A) (by C-G formula based on SCr of 1.3 mg/dL (H)).   Assessment: 71 y.o male [facility] with new onset Afib RVR and AKI. Pharmacy consulted to manage Coumadin. No oral anticoagulation PTA.   INR is therapeutic at 2.56 today. Need to watch closely with liver function. H&H remains stable and plt low/stable. No overt s/sx of bleeding noted. Stress test today with plan for cath if reversible ischemia present.  Goal of Therapy:  INR 2-3 Monitor platelets by anticoagulation protocol: Yes   Plan:  Coumadin 5 mg PO tonight Daily INR Monitor for s/sx bleeding    Loura BackJennifer [place], PharmD, BCPS Clinical Pharmacist Phone for today [phone]- [phone] Main pharmacy - [phone] 08/10/2016 11:44 AM

## 2016-08-10 NOTE — Progress Notes (Signed)
   Jerry Baker presented for a lexiscan cardiolite today.  No immediate complications.  Stress imaging is pending at this time.  Nicolasa Duckinghristopher Deshawna Mcneece, NP 08/10/2016, 1:07 PM

## 2016-08-10 NOTE — Progress Notes (Signed)
Progress Note  Patient Name: Jerry Baker Date of Encounter: 08/10/2016  Primary Cardiologist: Armanda Magicraci Turner wants pt to [facility] to [facility]   Subjective   Feeling well. No chest pain, sob or palpitations.   Inpatient Medications    Scheduled Meds: . clopidogrel  75 mg Oral Daily  . diltiazem  120 mg Oral Daily  . DULoxetine  90 mg Oral Daily  . folic acid  1 mg Oral Daily  . metoprolol tartrate  50 mg Oral BID  . multivitamin with minerals  1 tablet Oral Daily  . thiamine  100 mg Oral Daily  . Warfarin - Pharmacist Dosing Inpatient   Does not apply q1800   Continuous Infusions:  PRN Meds: acetaminophen **OR** acetaminophen, promethazine   Vital Signs    Vitals:   08/09/16 1555 08/09/16 1800 08/09/16 2106 08/10/16 0426  BP: 117/72  112/75   Pulse: 87  75   Resp:      Temp:  98.5 F (36.9 C)    TempSrc:  Oral    SpO2:  97%    Weight:    187 lb 12.8 oz (85.2 kg)  Height:        Intake/Output Summary (Last 24 hours) at 08/10/16 1058 Last data filed at 08/09/16 2100  Gross per 24 hour  Intake              320 ml  Output              200 ml  Net              120 ml   Filed Weights   08/08/16 0412 08/09/16 0556 08/10/16 0426  Weight: 189 lb 6.4 oz (85.9 kg) 188 lb 6.4 oz (85.5 kg) 187 lb 12.8 oz (85.2 kg)    Telemetry    NSR with PVCs - Personally Reviewed  ECG    None today   Physical Exam   GEN: No acute distress.   Neck: No JVD Cardiac: RRR, systolic murmurs, rubs, or gallops.  Respiratory: Clear to auscultation bilaterally. GI: Soft, nontender, non-distended  MS: No edema; No deformity. Neuro:  Nonfocal  Psych: Normal affect   Labs    Chemistry Recent Labs Lab 08/08/16 0649 08/09/16 0539 08/10/16 0543  NA 141 144 141  K 4.4 4.7 5.1  CL 105 102 104  CO2 24 32 29  GLUCOSE 98 100* 104*  BUN 35* 33* 30*  CREATININE 1.51* 1.42* 1.30*  CALCIUM 9.1 9.4 9.4  PROT 6.0* 6.3* 6.3*  ALBUMIN 3.3* 3.4* 3.5  AST 178*  113* 77*  ALT 729* 587* 446*  ALKPHOS 81 89 89  BILITOT 1.4* 1.4* 1.4*  GFRNONAA 45* 48* 54*  GFRAA 52* 56* >60  ANIONGAP 12 10 8      Hematology Recent Labs Lab 08/07/16 0500 08/09/16 0539 08/10/16 0543  WBC 8.2 10.0 9.1  RBC 3.97* 4.07* 4.19*  HGB 12.3* 13.2 13.3  HCT 38.7* 40.4 41.6  MCV 97.5 99.3 99.3  MCH 31.0 32.4 31.7  MCHC 31.8 32.7 32.0  RDW 14.0 14.5 14.5  PLT 89* 76* 89*    Cardiac Enzymes Recent Labs Lab 08/04/16 0323  TROPONINI 0.19*   No results for input(s): TROPIPOC in the last 168 hours.   BNPNo results for input(s): BNP, PROBNP in the last 168 hours.   DDimer No results for input(s): DDIMER in the last 168 hours.   Radiology    No results found.  Cardiac  Studies   Echocardiogram 08/05/16 Study Conclusions  - Left ventricle: The cavity size was moderately dilated. Wall thickness was normal. Systolic function was moderately to severely reduced. The estimated ejection fraction was in the range of 30% to 35%. Diffuse hypokinesis. Doppler parameters are consistent with a reversible restrictive pattern, indicative of decreased left ventricular diastolic compliance and/or increased left atrial pressure (grade 3 diastolic dysfunction). - Aortic valve: Trileaflet; mildly thickened, mildly calcified leaflets. There was trivial regurgitation. - Mitral valve: There was severe regurgitation. - Left atrium: The atrium was severely dilated. - Right ventricle: The cavity size was mildly dilated. Wall thickness was normal. Pacer wire or catheter noted in right ventricle. Systolic function was moderately reduced. - Pulmonary arteries: Systolic pressure was mildly increased. PA peak pressure: 35 mm Hg (S).  Pending stress test today   Patient Profile       71 y.o. 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 Previous stent placement. Problems started with contrast for right shoulder. Transferred with dyspnea, acute  renal failure,troponin mildly elevated history of ETOH abuse with DT risk.  Assessment & Plan    1. Polymorphic Vtach - patient was evaluated by EP for evaluation of Torsade. QT normal. ICD without shock with normal function. Keep K >4 and Mg > 2. Amiodarone contraindicated due to elevated LFTs. - Now maintaining sinus rhythm with frequent PVCs.   2. PAF - Maintaining sinus rhythm. Continue coumadin per pharmacy. Rate stable on CCB and BB. Will discontinue to CCB given low EF. If need rate control agent, up tirate BB.   3. CAD - No chest pain. For stress test today. Unable to perform cath due to elevated LFTs and Scr. Continue Plavix. Not on statin due to elevated LFTs.   4. ICM - Echo showed LVEF of 30-35%, diffuse hypokinesis, grade 3 DD, severe MR. For stress test today.  - Continue metoprolol. Not on ACE/ARB due to elevated Scr.   5. A on CKD - due to IV contrast used of CT of chest vs  prerenal progressing to ATN. Seen by nephrology. Scr improving.   6. Elevated LFTs - GI consulted- likely ischemic hepatitis in association with acute kidney injury, atrial fibrillation and ischemic cardiomyopathy. Negative CT and U/S negative for any evidence of cirrhosis. Patient with negative hepatitis panel.  May need further outpatient work up for underlying liver disease. Follow-up in GI clinic in 2 weeks after discharge. - LFTs improving.   7. Acute thrombocytopenia - Seen by oncology. Cause unknown, could be due to recent alcoholism, pancreatitis or liver disease Peripheral blood smear did not reveal any evidence of schistocytes.  Platelet clumping is not seen.   Signed, Manson Passey, PA  08/10/2016, 10:58 AM    I have seen and examined the patient along with Bhagat,Bhavinkumar, PA.  I have reviewed the chart, notes and new data.  I agree with PA's note.  Key new complaints: denies angina, dyspnea, presyncope or palpitations Key examination changes: appears clinically  euvolemic Key new findings / data: INR 2.5, creat better at 1.3  PLAN: Nuclear stress test today. If no reversible ischemia, plan to continue medical therapy. If he has reversible ischemia, will need coronary angio, but this will be delayed due to warfarin anticoagulation. He does not want to stay in the hospital while we wait. IF necessary, would DC home with plans to return for outpatient cath. I do not think "bridging" is essential if his warfarin is only stopped for a few  days. Resume statin after LFTs fully normalize.  ACEi/ARB to be started at follow up visit. Clinically euvolemic, does not need daily diuretic at this time.  Thurmon Fair, MD, [facility] [phone] 08/10/2016, 11:27 AM

## 2019-01-23 IMAGING — CT CT RENAL STONE PROTOCOL
2 of 4 series · 16 of 46 positions shown, 18 images · non-contrast
Comparison: None.

CLINICAL DATA: 70 y/o M; right flank pain. History of chronic
kidney disease, appendectomy, and partial colectomy.

EXAM:
CT ABDOMEN AND PELVIS WITHOUT CONTRAST
TECHNIQUE: Multidetector CT imaging of the abdomen and pelvis was performed
following the standard protocol without IV contrast.

[Series 3: renal stone 5.0 · axial · 0.98mm/px · z∈[-1062,-617]mm · 13 of 97 slices shown, 15 images]
[im 4/97  soft-tissue]
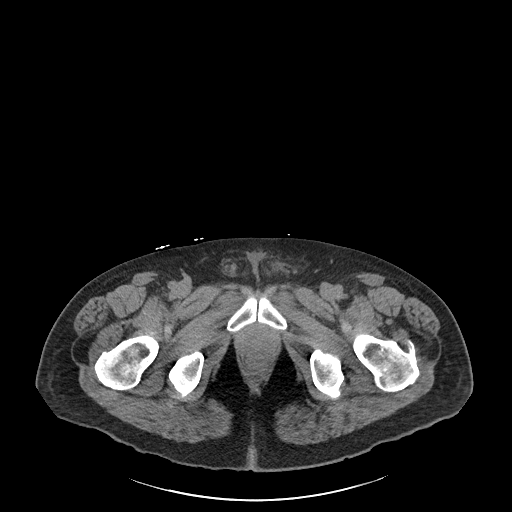
[im 4/97  bone]
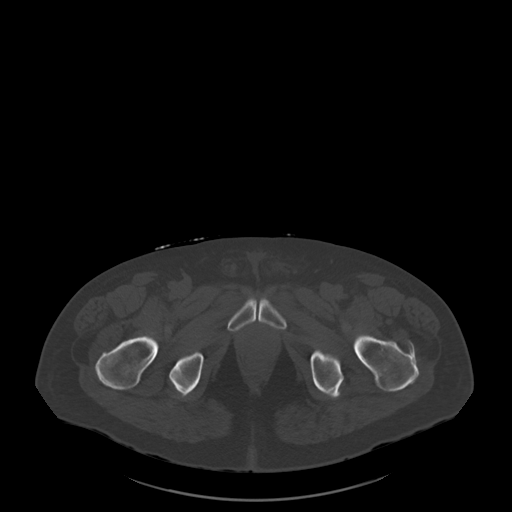
[im 12/97  soft-tissue]
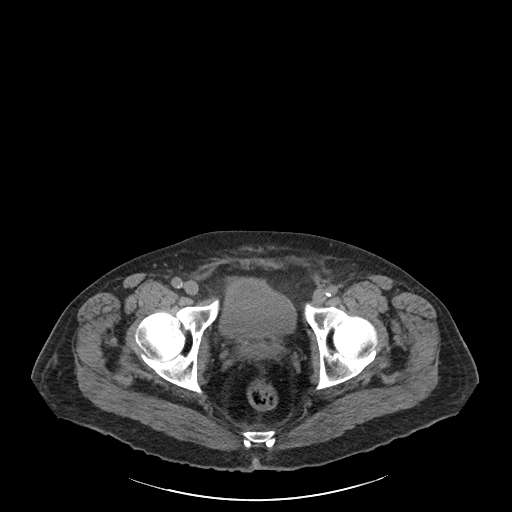
[im 20/97  soft-tissue]
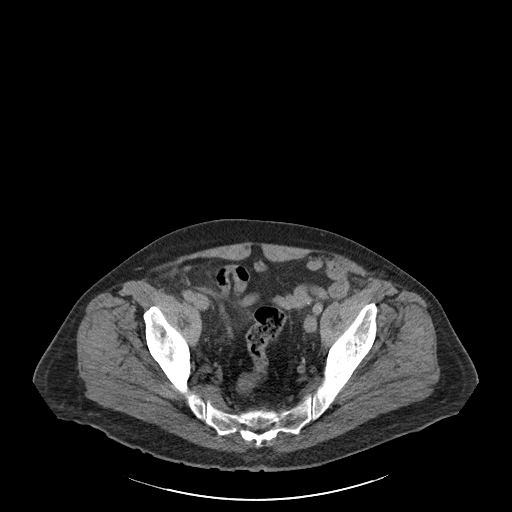
[im 27/97  soft-tissue]
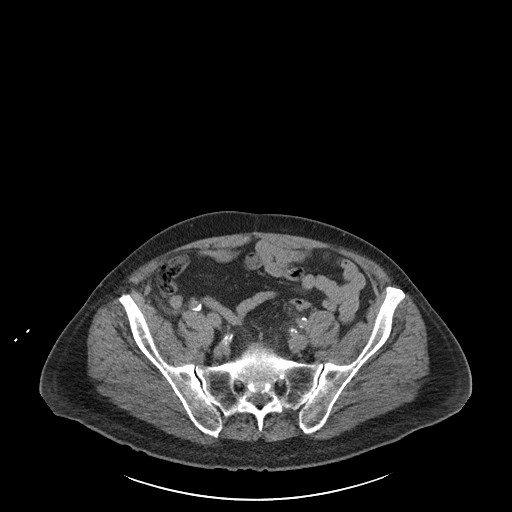
[im 35/97  soft-tissue]
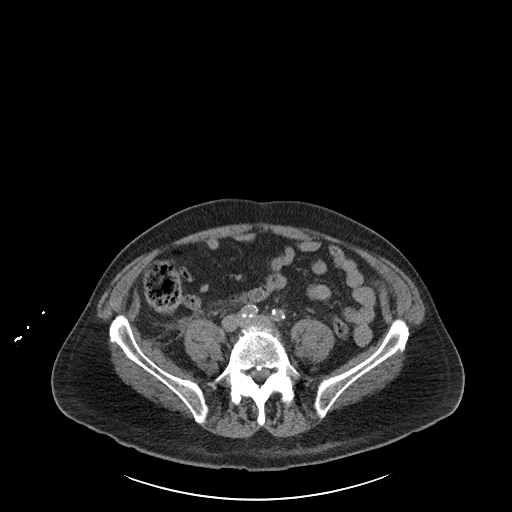
[im 43/97  soft-tissue]
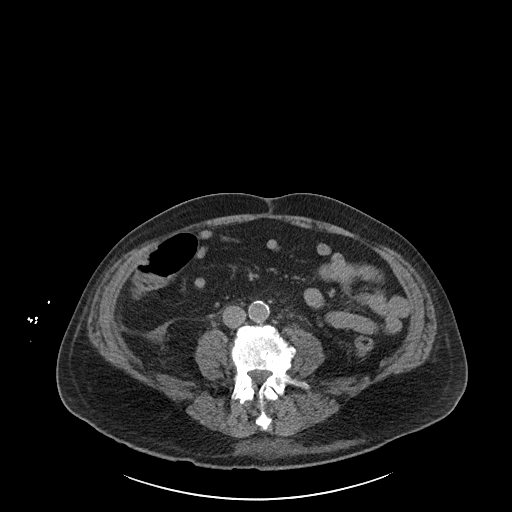
[im 50/97  soft-tissue]
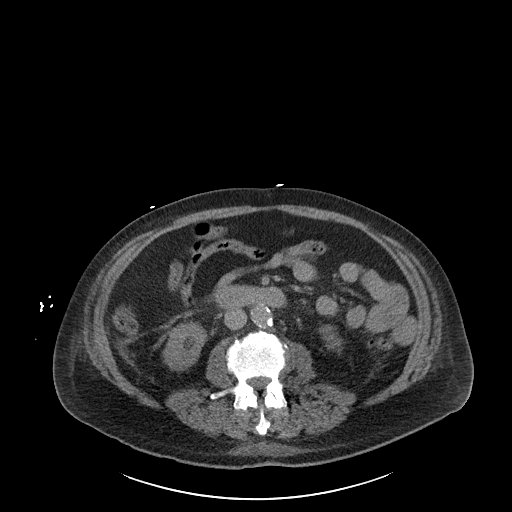
[im 54/97  soft-tissue]
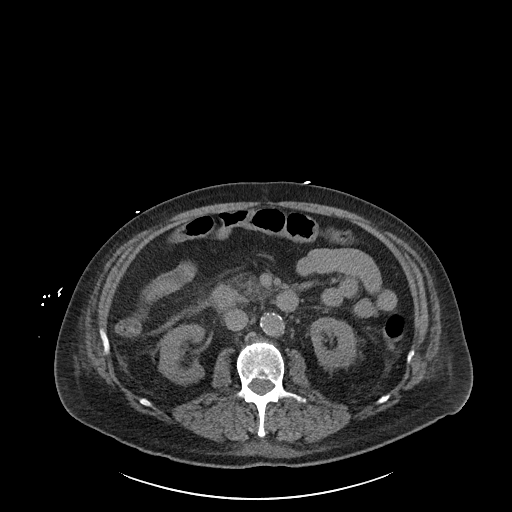
[im 62/97  soft-tissue]
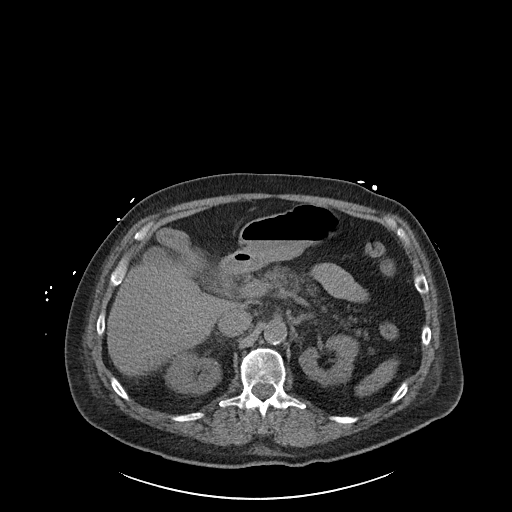
[im 62/97  bone]
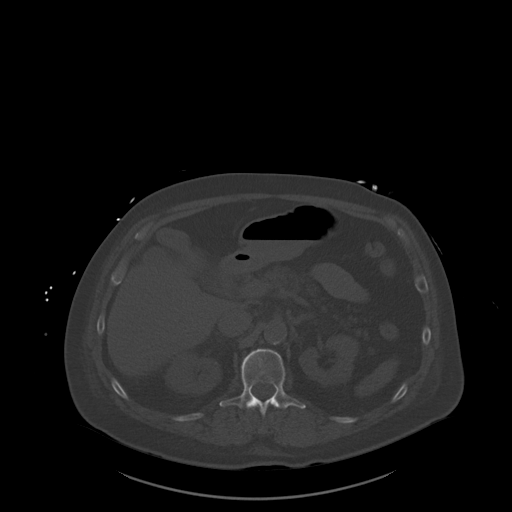
[im 70/97  soft-tissue]
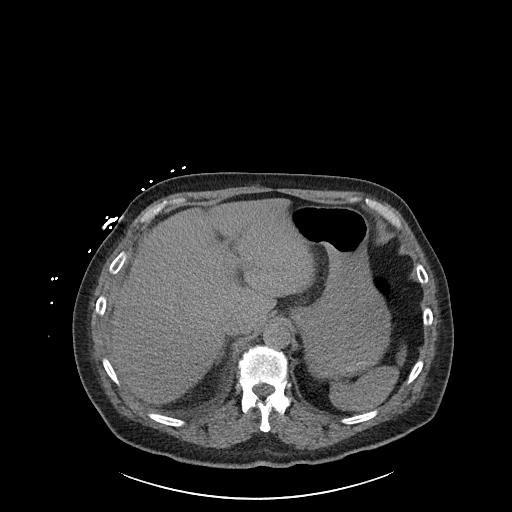
[im 77/97  soft-tissue]
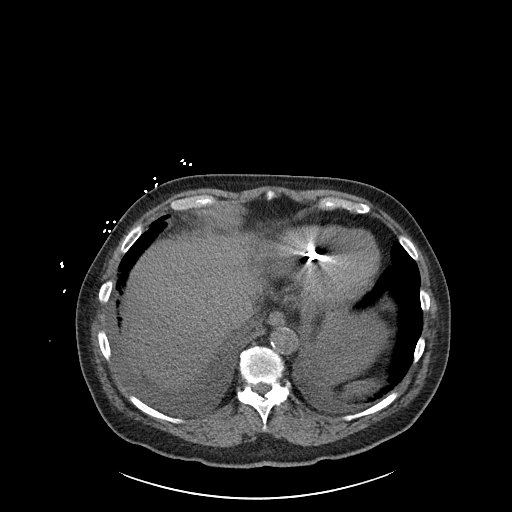
[im 85/97  soft-tissue]
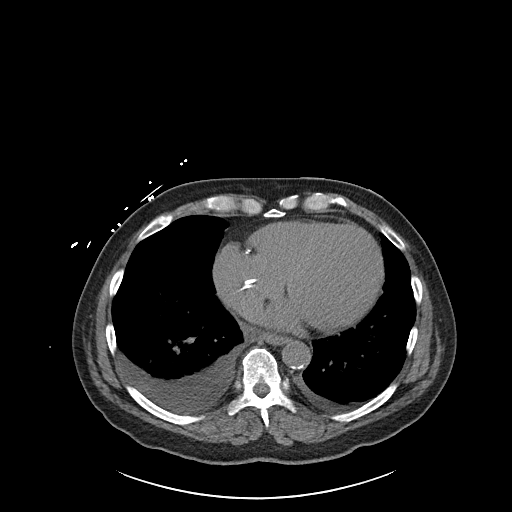
[im 93/97  soft-tissue]
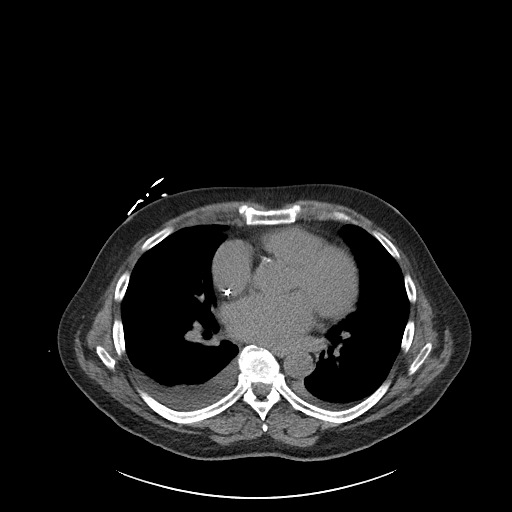

[Series 5: renal stone 3.0 cor · coronal · 0.74mm/px · 3 of 94 slices shown]
[im 32/94  soft-tissue]
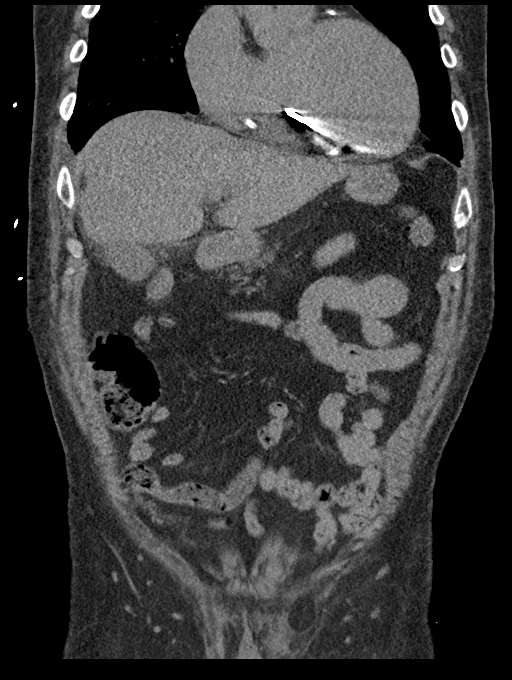
[im 42/94  soft-tissue]
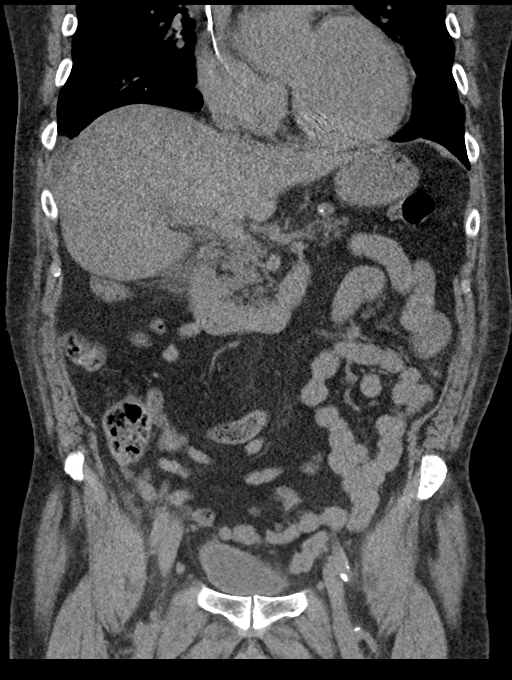
[im 52/94  soft-tissue]
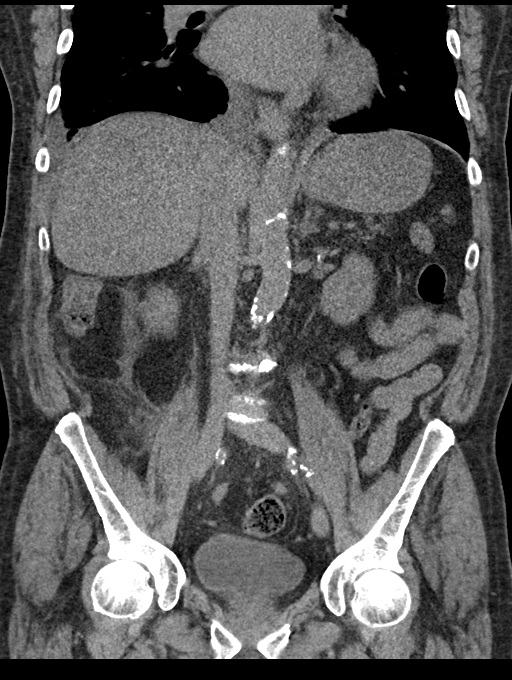

[16 of 46 positions shown; findings below may reference images not displayed]

FINDINGS: Lower chest: AICD lead in the right ventricle. Severe coronary
artery calcification. Normal heart size. No pericardial effusion.
Small right greater than left pleural effusions.

Hepatobiliary: No focal liver abnormality is seen. No gallstones,
gallbladder wall thickening, or biliary dilatation.

Pancreas: There is indistinctness of fat surrounding the pancreatic
and retroperitoneal edema greater on the right.

Spleen: Normal in size without focal abnormality.

Adrenals/Urinary Tract: Adrenal glands are unremarkable. Kidneys are
normal, without renal calculi, focal lesion, or hydronephrosis.
Bladder is unremarkable.

Stomach/Bowel: Stomach is within normal limits. Appendix appears
normal. No evidence of bowel wall thickening, distention, or
inflammatory changes. Few sigmoid diverticular present. Colorectal
anastomosis is patent without obstruction.

Vascular/Lymphatic: Aortic atherosclerosis. No enlarged abdominal or
pelvic lymph nodes.

Reproductive: Prostate is unremarkable.

Other: No abdominal wall hernia or abnormality. No abdominopelvic
ascites.

Musculoskeletal: Moderate spondylosis of the visible thoracolumbar
spine. No acute osseous abnormality is evident.
IMPRESSION: 1. Indistinctness of pancreatic head and mild nonspecific
retroperitoneal edema may represent acute pancreatitis. Correlation
with lipase recommended.
2. No hydronephrosis or urinary stone disease.
3. Mild sigmoid diverticulosis. No obstructive or inflammatory
changes of bowel identified.
4. Small bilateral pleural effusions.

By: Drey Jim M.D.

## 2019-01-23 IMAGING — CR DG CHEST 1V PORT
1 series · 1 of 1 positions shown · non-contrast
Comparison: None in PACs

CLINICAL DATA: Shortness of breath, history of CHF, cardiomyopathy,
coronary artery disease, chronic renal insufficiency.

EXAM:
PORTABLE CHEST 1 VIEW

[AP]
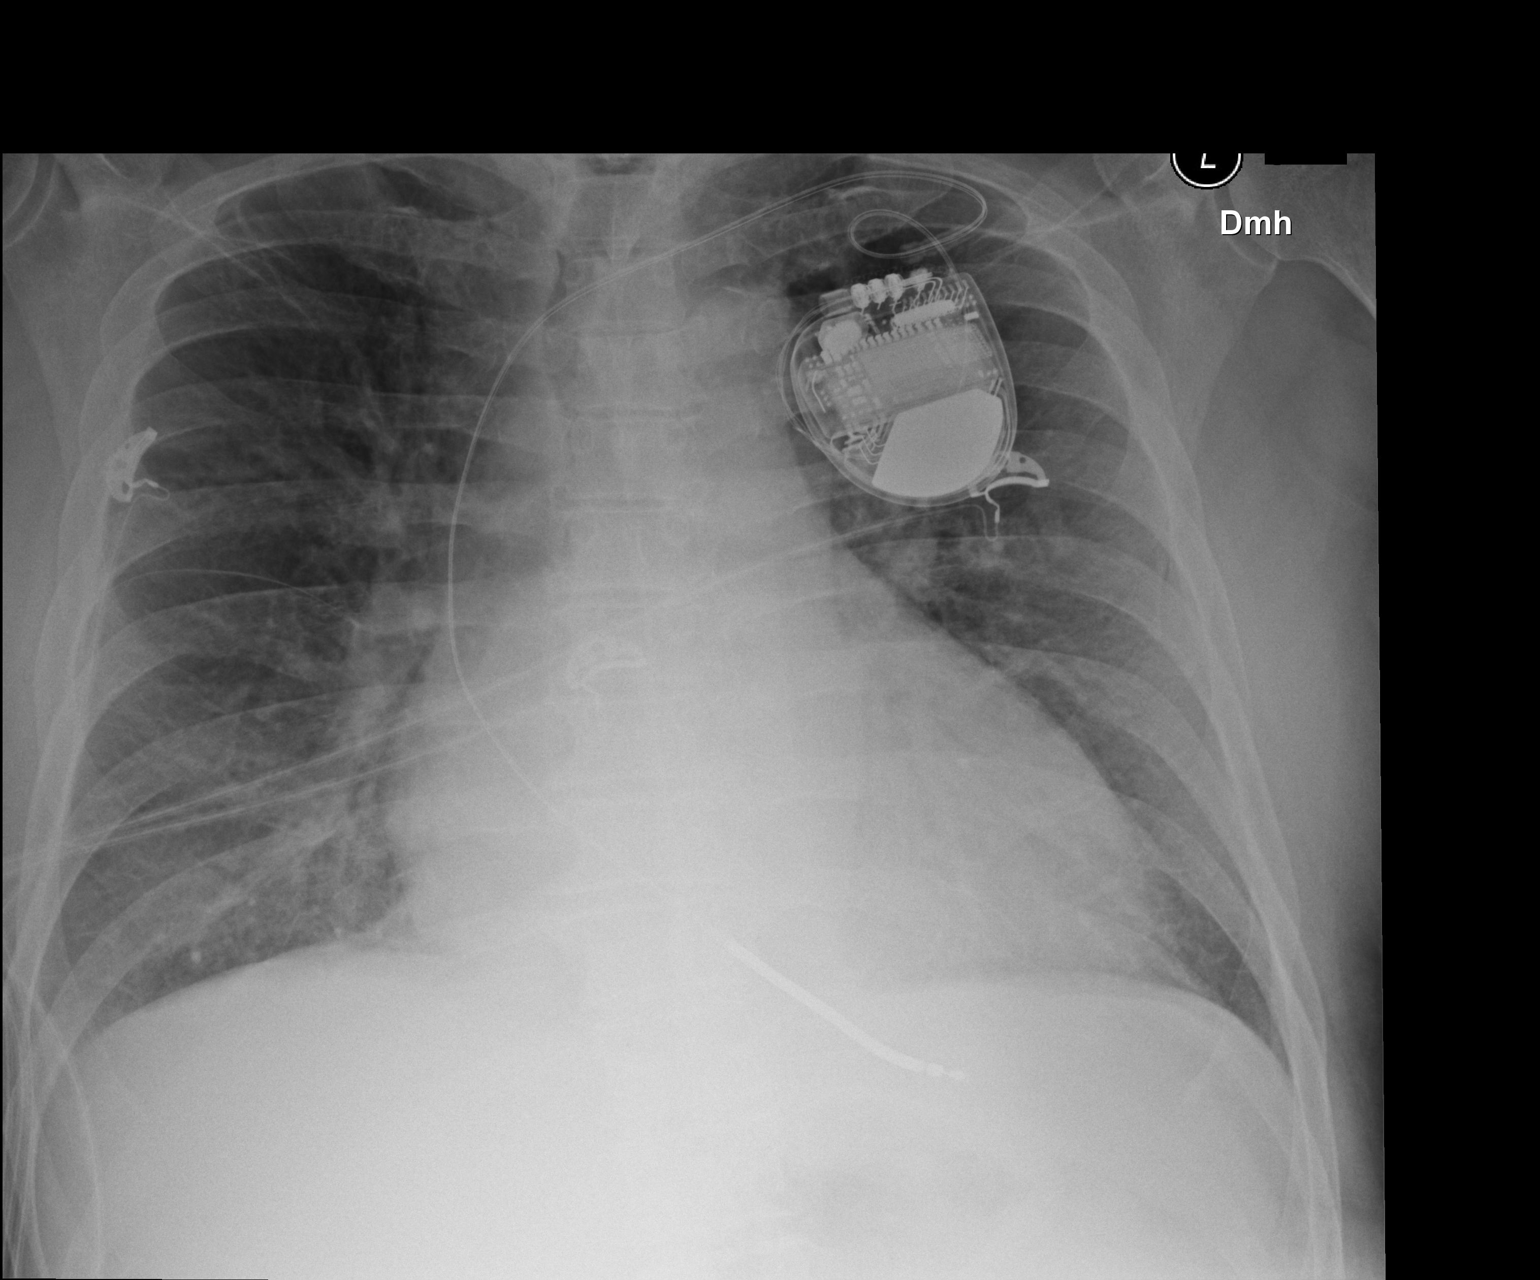

[1 of 1 positions shown; findings below may reference images not displayed]

FINDINGS: The lungs are well-expanded. The interstitial markings are mildly
prominent especially at the right lung base. The cardiac silhouette
is enlarged. The pulmonary vascularity mildly engorged. The ICD is
in stable position. There is calcification in the wall of the aortic
arch. The bony thorax exhibits no acute abnormality.
IMPRESSION: CHF with mild pulmonary interstitial edema. Minimal right infrahilar
subsegmental atelectasis is suspected.
